# Patient Record
Sex: Female | Born: 1965 | Race: White | Hispanic: No
Health system: Midwestern US, Academic
[De-identification: ages and names within clinical notes are randomized; demographics above are authoritative.]

---

## 2019-10-30 ENCOUNTER — Encounter: Admit: 2019-10-30 | Discharge: 2019-10-30 | Payer: BC Managed Care – PPO

## 2019-10-30 DIAGNOSIS — B999 Unspecified infectious disease: Secondary | ICD-10-CM

## 2019-10-30 DIAGNOSIS — M069 Rheumatoid arthritis, unspecified: Secondary | ICD-10-CM

## 2019-10-30 DIAGNOSIS — L719 Rosacea, unspecified: Secondary | ICD-10-CM

## 2019-10-30 DIAGNOSIS — H18509 Corneal dystrophy: Secondary | ICD-10-CM

## 2019-10-30 DIAGNOSIS — M1990 Unspecified osteoarthritis, unspecified site: Secondary | ICD-10-CM

## 2019-10-31 ENCOUNTER — Encounter: Admit: 2019-10-31 | Discharge: 2019-10-31 | Payer: BC Managed Care – PPO

## 2019-11-01 ENCOUNTER — Encounter: Admit: 2019-11-01 | Discharge: 2019-11-01 | Payer: BC Managed Care – PPO

## 2019-11-02 ENCOUNTER — Encounter: Admit: 2019-11-02 | Discharge: 2019-11-02 | Payer: BC Managed Care – PPO

## 2019-11-02 DIAGNOSIS — M542 Cervicalgia: Secondary | ICD-10-CM

## 2019-11-02 NOTE — Patient Instructions
It was nice to see you today. Thank you for choosing to visit our clinic.        Your time is important and if you had to wait today, we do apologize. Our goal is to run exactly on time; however, on occasion, we get behind in clinic due to unexpected patient issues. Thank you for your patience.     General Instructions:   How to reach me: Please send a MyChart message to the Spine Center or leave a voicemail for Adam, RN at [phone].   How to get a medication refill: Please use the MyChart Refill request or contact your pharmacy directly to request medication refills. We do not do same day refills on controlled substances.   How to receive your test results: If you have signed up for MyChart, you will receive your test results and messages from me this way. Otherwise, you will get a phone call or letter. If you are expecting results and have not heard from my office within 2 weeks of your testing, please send a MyChart message or call my office.   Scheduling: Our scheduling phone number is [phone].   Appointment Reminders on your cell phone: Make sure we have your cell phone number, and Text [place] to [phone].   Support for many chronic illnesses is available through [facility]: [facility] or 913-574-0900.   For questions on nights, weekends or holidays, call the Operator at [phone], and ask for the doctor on call for Orthopedics        Again, thank you for coming in today.

## 2019-11-06 ENCOUNTER — Encounter: Admit: 2019-11-06 | Discharge: 2019-11-06 | Payer: BC Managed Care – PPO

## 2019-11-06 ENCOUNTER — Ambulatory Visit: Admit: 2019-11-06 | Discharge: 2019-11-06 | Payer: BC Managed Care – PPO

## 2019-11-06 DIAGNOSIS — L719 Rosacea, unspecified: Secondary | ICD-10-CM

## 2019-11-06 DIAGNOSIS — R519 Generalized headaches: Secondary | ICD-10-CM

## 2019-11-06 DIAGNOSIS — B999 Unspecified infectious disease: Secondary | ICD-10-CM

## 2019-11-06 DIAGNOSIS — M1990 Unspecified osteoarthritis, unspecified site: Secondary | ICD-10-CM

## 2019-11-06 DIAGNOSIS — M542 Cervicalgia: Secondary | ICD-10-CM

## 2019-11-06 DIAGNOSIS — S129XXD Fracture of neck, unspecified, subsequent encounter: Secondary | ICD-10-CM

## 2019-11-06 DIAGNOSIS — H18509 Corneal dystrophy: Secondary | ICD-10-CM

## 2019-11-06 DIAGNOSIS — M069 Rheumatoid arthritis, unspecified: Secondary | ICD-10-CM

## 2019-11-06 DIAGNOSIS — K259 Gastric ulcer, unspecified as acute or chronic, without hemorrhage or perforation: Secondary | ICD-10-CM

## 2019-11-06 DIAGNOSIS — D649 Anemia, unspecified: Secondary | ICD-10-CM

## 2019-11-09 ENCOUNTER — Encounter: Admit: 2019-11-09 | Discharge: 2019-11-09 | Payer: BC Managed Care – PPO

## 2019-11-09 NOTE — Telephone Encounter
Received VM from [facility] radiology stating that order for CT cervical spine needs physical signature. Fax [phone]    Ramonita Lab, RN

## 2019-11-13 ENCOUNTER — Encounter: Admit: 2019-11-13 | Discharge: 2019-11-13 | Payer: BC Managed Care – PPO

## 2019-11-13 NOTE — Telephone Encounter
Placed call to [facility] to request imaging to be clouded.    Ramonita Lab, RN

## 2019-11-14 ENCOUNTER — Encounter: Admit: 2019-11-14 | Discharge: 2019-11-14 | Payer: BC Managed Care – PPO

## 2019-11-21 ENCOUNTER — Encounter: Admit: 2019-11-21 | Discharge: 2019-11-21 | Payer: BC Managed Care – PPO

## 2019-11-21 DIAGNOSIS — M542 Cervicalgia: Secondary | ICD-10-CM

## 2019-11-22 ENCOUNTER — Encounter: Admit: 2019-11-22 | Discharge: 2019-11-22 | Payer: BC Managed Care – PPO

## 2019-11-22 ENCOUNTER — Ambulatory Visit: Admit: 2019-11-22 | Discharge: 2019-11-22 | Payer: BC Managed Care – PPO

## 2019-11-22 MED ORDER — BUPIVACAINE (PF) 0.25 % (2.5 MG/ML) IJ SOLN
3 mL | Freq: Once | INTRAMUSCULAR | 0 refills | Status: CP
Start: 2019-11-22 — End: ?
  Administered 2019-11-22: 16:00:00 3 mL via INTRAMUSCULAR

## 2019-11-22 MED ORDER — IOPAMIDOL 41 % IT SOLN
2.5 mL | Freq: Once | EPIDURAL | 0 refills | Status: CP
Start: 2019-11-22 — End: ?
  Administered 2019-11-22: 16:00:00 2.5 mL via EPIDURAL

## 2019-11-22 MED ORDER — IOHEXOL 240 MG IODINE/ML IV SOLN
2.5 mL | Freq: Once | EPIDURAL | 0 refills | Status: DC
Start: 2019-11-22 — End: 2019-11-22

## 2019-11-23 ENCOUNTER — Encounter: Admit: 2019-11-23 | Discharge: 2019-11-23 | Payer: BC Managed Care – PPO

## 2019-11-23 NOTE — Telephone Encounter
Procedure: SNR with Bupivicaine  Laterality: Right  Location: C6-7     Relief percentage 50% lasting 2 hours      Ramonita Lab, RN

## 2019-11-30 ENCOUNTER — Encounter: Admit: 2019-11-30 | Discharge: 2019-11-30 | Payer: BC Managed Care – PPO

## 2019-11-30 NOTE — Telephone Encounter
Patient's husband LVM re: results. Returned call, previous note states Dr. Kalman Shan would like to see patient in clinic for follow up related to [facility] and CT Scan, apt scheduled.

## 2019-11-30 NOTE — Telephone Encounter
Pain diary from [facility]

## 2019-12-25 ENCOUNTER — Encounter: Admit: 2019-12-25 | Discharge: 2019-12-25 | Payer: BC Managed Care – PPO

## 2019-12-25 ENCOUNTER — Ambulatory Visit: Admit: 2019-12-25 | Discharge: 2019-12-25 | Payer: BC Managed Care – PPO

## 2019-12-25 NOTE — Patient Instructions
It was nice to see you today.  Thank you for choosing to visit our clinic.    Your time is important and if you had to wait today, we do apologize.  Our goal is to run exactly on time.  However, on occasion, we get behind in clinic due to unexpected patient issues.  Thank you for your patience.    General Instructions:   How to reach me:  Please send a MyChart message to the Spine Center or leave a voicemail for the nurse at [phone].   How to get a medication refill:  Please use the MyChart Refill request or contact your pharmacy directly to request medication refills.  We do not do same day refills on controlled substances.   How to receive your test results:  If you have signed up for MyChart, you will receive your test results and messages from me this way.  Otherwise, you will get a phone call or letter.  If you are expecting results and have not heard from my office within 2 weeks of your testing, please send a MyChart message or call my office.   Scheduling:  Our scheduling phone number is [phone].   Appointment Reminders on your cell phone:  Make sure we have your cell phone number, and Text [place] to [phone].   Support for many chronic illnesses is available through [facility]: [facility] or 913-574-0900.   For questions on nights, weekends or holidays, call the Operator at [phone], and ask for the doctor on call for Orthopedics.      Again, thank you for coming in today.

## 2020-02-01 ENCOUNTER — Encounter: Admit: 2020-02-01 | Discharge: 2020-02-01 | Payer: BC Managed Care – PPO

## 2020-02-01 DIAGNOSIS — S129XXD Fracture of neck, unspecified, subsequent encounter: Secondary | ICD-10-CM

## 2020-02-01 DIAGNOSIS — Z01812 Encounter for preprocedural laboratory examination: Secondary | ICD-10-CM

## 2020-02-01 MED ORDER — CEFAZOLIN INJ 1GM IVP
2 g | Freq: Once | INTRAVENOUS | 0 refills | Status: CN
Start: 2020-02-01 — End: ?

## 2020-02-01 NOTE — Progress Notes
Surgery Scheduling Form    Name: BRAIDYN PEACE MRN: [id number] DOB: Feb 07, 1966 Sex: female     Patient Type: EXT REC  Procedure: c6-7 posterior cervical wiring and foraminotomy   Length: 120 minutes    CPT Codes: 45409, 22600, 22660, 63040    Dx:   pseutoarthrosis of cervical spine    Workup Needed: standard    Work Comp?: No      Positioning: prone    Equipment: neuromonitoring, mayfield tongs    Implants?: Yes . Co Chrome wires      Procedure, codes, and equipment verified: Yes

## 2020-02-01 NOTE — Telephone Encounter
LM to schedule pre-op and surgery dates

## 2020-02-01 NOTE — Telephone Encounter
June 23rd for surgery date, pre-op scheduled with patient.  Info sent to [facility] for prior auth.

## 2020-02-19 ENCOUNTER — Encounter: Admit: 2020-02-19 | Discharge: 2020-02-19 | Payer: BC Managed Care – PPO

## 2020-02-19 NOTE — Telephone Encounter
Spoke with patient. Yes Madeline Barry has been approved through [employer] per [facility]. She has no other questions or concerns at this time.

## 2020-02-19 NOTE — Telephone Encounter
Email sent to [facility] to follow up on surgery auth status

## 2020-02-19 NOTE — Telephone Encounter
Per [facility] - Yes, the prior Berkley Harvey has been approved

## 2020-02-19 NOTE — Telephone Encounter
Incoming VM - surgery on 6.23 and wanted to know if [employer] has approved prior auth yet

## 2020-02-28 NOTE — Patient Instructions
Thank you for visiting [place] at the [facility] at [facility].    Today you saw Dr. Mare Ferrari for your Pre-Op Visit.  ? You will receive a call the day prior to your surgery with your official ARRIVAL TIME  ? This call will be in the evening prior to surgery between 2pm and 6pm  ? For questions regarding our visitor policy please call [phone] or visit: [email or web address].  ? Visitor Policy    Below are your instructions for your upcoming surgery. We also encourage you to review the following to learn more about your health:  ? Education attached to this visit summary  ? [facility] located in our lobby  ? [email or web address]    We also highly recommend you sign-up for MyChart, our patient portal. With MyChart you can:  ? Communicate with your care team   ? Access your lab and test results  ? View upcoming appointment information    If you need any assistance accessing MyChart, please call [phone].    Your care team is here to help you, please do not hesitate to contact us with any questions or concerns.      Clinical Nurse Coordinator - Dr. Mare Ferrari, MD  The [facility] [place] [facility]   [facility]  Phone: [phone]        Instructions    Getting Ready for Surgery    FMLA/ Short Term Disability Paperwork    ? Please fax any FMLA/ Short Term Disability paperwork to [phone]. Please note that we require 72 business hours to complete these forms and have them signed by Dr. Azucena Cecil.  ? Your paperwork should clearly state to whom and where forms are to be sent upon completion.  ? For any questions regarding your paperwork, please call [phone] Aurther Loft Orrick: Environmental health practitioner).  ? Please do not mail or drop off paperwork to the Spine Center, this can delay completion of your paperwork.    COVID-19    ? You must be tested for COVID-19 exactly 2 days prior to surgery.  ? This test must be performed at one of our Testing Clinics, this is to ensure we have the most up-to-date result possible to protect your safety.  ? Directions and instructions for this test will be under the appointment section of your After Visit Summary.    Night Before Surgery    ? You will not be able to eat or drink after midnight on the morning of your surgery.  ? You should prepare loose, comfortable clothing for your hospital stay.  ? During your pre-operative visit, your Anesthesia provider will tell you which medicines you may take the morning of surgery.    Surgical Prep/ Shower:    ? You will need to take a special shower the night before AND the morning of your procedure.  ? Our office will provide you with an anti-bacterial soap (CHG Scrub) during your pre-operative visit.     1. The night before surgery you will need to shower  2. Use your typical bath products and rinse  3. After rinsing, complete the CHG scrub  4. Lather CHG well for a full 5 minutes  5. Rinse completely  6. Dry with a clean towel  7. Do not apply any lotion, cream, powder, or deodorant as this can deactivate the CHG  8. Repeat the CHG scrub in the morning at home before admission  Your Hospital Stay  Orthopedic Unit    ? If your surgery requires admission to the hospital, you will most likely be staying in our specialized orthopedic unit in [facility] [address].  ? We request private rooms for all of our patients, but please be aware that you may be admitted to a semi-private (shared) room.    Preventing Falls    ? After surgery do not get out of bed alone, you will need a nurse or patient care assistant to help you walk and to go to the bathroom.  ? You will be working with physical therapy to make a therapy plan and recovery goals.  ? Physical  therapy will help your team decide how to get you home safely, or if you may need skilled care after your hospital stay.    Pain    ? Spinal surgery is not a pain free process, but we will do everything we can to manage your pain.  ? You should not expect to have immediate relief of your symptoms, healing and recovery will take time depending on your procedure.  ? Please let your nurse know if your pain is not managed so they can work to keep you as comfortable as possible and to help you recover.  ? Bedrest will not help you recover from surgery, the more you walk, the faster you will recover from surgery.        Recovering at [facility] of your incision    ? Your incision must stay DRY after surgery.  ? A detachable shower head is recommended for bathing after surgery.  ? Change the bandage on the incision EVERY OTHER DAY.   ? Dress incision with clean gauze and tape.  ? Report ANY change in drainage.  ? Report ANY increasing redness.  ? Please do not wait to notify Dr. Louie Boston office of any signs of wound infection, this could allow any potential infection to worsen and delay your recovery.  ? Failure to report signs of infection could make you very sick and could potentially require repeat surgery.  ? If you have ANY concerning symptoms, it is always better to ask.    Signs of Infection    ? Temperature of 100 degrees Farenheit or higher  ? Fever  ? Chills  ? Redness of incision  ? Drainage from incision     Pain Medication    ? Spine surgery can be painful and it may not be possible to relieve 100% of your post-operative pain.  ? We want your pain to be as controlled as possible so that you are able to be active after surgery (walking a total of 30 minutes per day).  ? Only take medication if you need it. Decrease your dosage as you are able.  ? Do not obtain any prescriptions for pain from other providers until you have been released from Dr. Louie Boston care.  ? We cannot refill medications same day: please call AT LEAST 2 business days in advance so that you do not run out.  ? If your pain is uncontrolled, please call Dr. Louie Boston office.    Constipation    ? Pain medication is often constipating.  ? Before surgery, purchase an over-the counter stool softener (colace, docusate, or miralax). You may take these medications 1 to 2 times per day depending on your needs.  ? Drink 6-8 glasses of water everyday.  ? Contact our office if you have gone more  than 3 days with no bowel movement.    Activity    ? NO bending or twisting of the spine.  ? NO lifting greater than 5lbs (similar to a gallon of milk).  ? NO activity that requires gym equipment.  ? NO driving until you are off mind-altering medications and you are cleared by Dr. Azucena Cecil.  ? NO physical therapy or occupational therapy unless cleared by Dr.Burton. If physical therapy is needed, this will be ordered approximately 8 weeks after surgery.  ? Walking is your recommended therapy/exercise after surgery, we encourage you to be walking at a natural, gentle pace for at least 30 minutes total per day.        Cervical (Neck) Collar Care    For patients undergoing spinal fusion of the neck: You will be discharged from the hospital wearing a neck collar for 3-6 weeks post-op    Motion Restriction    ? Keeping your head and neck as still as possible is an important part of the healing process  ? Keep your collar on and properly tightened at all times. Remove it only to wash your face and neck  ? Remove your collar only with the help of another person    Proper Skin Care    ? Pressure, moisture, heat, and dirt can all lead to skin redness and sores  ? To avoid this, keep your skin clean, dry, and cool  ? At least once every other day, remove the collar and wash your neck and face  ? At this time, moist or dirty pads should be changed  ? Keep your head and neck still while the collar is off  ? If you notice any skin redness or sores, call our office    Instructions for Removal, Skin Care, and Re-Application    ? Before taking off your collar, gather the supplies you will need: soap, wash cloth, towel, and pads.  ? Stand or sit in front of a sink with a mirror. Release the strap on one side. Remove the collar and set it aside.  ? Keep your head and neck straight and still. Use a wash cloth to clean your face and neck.  ? Rinse away soap and gently dry your skin.  ? Remove moist and/or dirty pads.  ? If needed, clean and towel dry the plastic and straps. Attach the clean pads.  ? Place the front of the collar so your chin comes to the front edge of the chin piece.  ? Place the back panel behind your neck.  ? Connect the straps on both sides and tighten.  ? Tighten the Support Strap until secure and comfortable         Clinical Nurse Coordinator ? Dr. Mare Ferrari, M.D.  The [facility]  [place], [place] [address]  Phone: [phone]

## 2020-02-29 ENCOUNTER — Encounter: Admit: 2020-02-29 | Discharge: 2020-02-29 | Payer: BC Managed Care – PPO

## 2020-02-29 ENCOUNTER — Ambulatory Visit: Admit: 2020-02-29 | Discharge: 2020-02-29 | Payer: BC Managed Care – PPO

## 2020-02-29 DIAGNOSIS — L719 Rosacea, unspecified: Secondary | ICD-10-CM

## 2020-02-29 DIAGNOSIS — M797 Fibromyalgia: Secondary | ICD-10-CM

## 2020-02-29 DIAGNOSIS — K219 Gastro-esophageal reflux disease without esophagitis: Secondary | ICD-10-CM

## 2020-02-29 DIAGNOSIS — M1990 Unspecified osteoarthritis, unspecified site: Secondary | ICD-10-CM

## 2020-02-29 DIAGNOSIS — S129XXD Fracture of neck, unspecified, subsequent encounter: Secondary | ICD-10-CM

## 2020-02-29 DIAGNOSIS — H18509 Corneal dystrophy: Secondary | ICD-10-CM

## 2020-02-29 DIAGNOSIS — M2550 Pain in unspecified joint: Secondary | ICD-10-CM

## 2020-02-29 DIAGNOSIS — Z01818 Encounter for other preprocedural examination: Secondary | ICD-10-CM

## 2020-02-29 DIAGNOSIS — R519 Generalized headaches: Secondary | ICD-10-CM

## 2020-02-29 DIAGNOSIS — G629 Polyneuropathy, unspecified: Secondary | ICD-10-CM

## 2020-02-29 DIAGNOSIS — F329 Major depressive disorder, single episode, unspecified: Secondary | ICD-10-CM

## 2020-02-29 DIAGNOSIS — T148XXA Other injury of unspecified body region, initial encounter: Secondary | ICD-10-CM

## 2020-02-29 DIAGNOSIS — K259 Gastric ulcer, unspecified as acute or chronic, without hemorrhage or perforation: Secondary | ICD-10-CM

## 2020-02-29 DIAGNOSIS — B999 Unspecified infectious disease: Secondary | ICD-10-CM

## 2020-02-29 DIAGNOSIS — Z01812 Encounter for preprocedural laboratory examination: Secondary | ICD-10-CM

## 2020-02-29 DIAGNOSIS — R112 Nausea with vomiting, unspecified: Secondary | ICD-10-CM

## 2020-02-29 DIAGNOSIS — D649 Anemia, unspecified: Secondary | ICD-10-CM

## 2020-02-29 DIAGNOSIS — M069 Rheumatoid arthritis, unspecified: Secondary | ICD-10-CM

## 2020-02-29 LAB — COMPREHENSIVE METABOLIC PANEL
Lab: 0.4 mg/dL (ref 0.3–1.2)
Lab: 0.7 mg/dL (ref <20.7)
Lab: 107 MMOL/L (ref 98–110)
Lab: 14 mg/dL (ref 7–25)
Lab: 141 MMOL/L (ref 137–147)
Lab: 21 U/L (ref 7–40)
Lab: 25 U/L (ref 7–56)
Lab: 26 MMOL/L (ref 21–30)
Lab: 4.2 g/dL (ref 3.5–5.0)
Lab: 6.5 g/dL (ref 6.0–8.0)
Lab: 82 U/L (ref 25–110)
Lab: 9.2 mg/dL (ref 8.5–10.6)
Lab: 93 mg/dL (ref 70–100)
Lab: >60 mL/min (ref >60)
Lab: >60 mL/min (ref >60)
Lab: 8 (ref 3–12)

## 2020-02-29 LAB — PROTIME INR (PT): Lab: 1.1 MMOL/L (ref 0.8–1.2)

## 2020-02-29 LAB — CBC: Lab: 4.8 10*3/uL (ref >60)

## 2020-02-29 NOTE — Pre-Anesthesia Patient Instructions
GENERAL INFORMATION    Before you come to the hospital  ? Make arrangements for a responsible adult to drive you home and stay with you for 24 hours following surgery.  ? Bath/Shower Instructions  ? Take a bath or shower with antibacterial soap the night before or the morning of your procedure. Use clean towels.  ? Put on clean clothes after bath or shower.  Avoid using lotion and oils.  ? If you are having surgery above the waist, wear a shirt that fastens up the front.  ? Sleep on clean sheets if bath or shower is done the night before procedure.  ? Leave money, credit cards, jewelry, and any other valuables at home. [facility] is not responsible for the loss or breakage of personal items.  ? Remove nail polish, makeup and all jewelry (including piercings) before coming to the hospital.  ? The morning of your procedure:  ? brush your teeth and tongue  ? do not smoke  ? do not shave the area where you will have surgery    What to bring to the hospital  ? ID/ Insurance Card  ? Medical Device card  ? Official documents for legal guardianship   ? Copy of your Living Will, Advanced Directives, and/or Durable Power of Attorney   ? Small bag with a few personal belongings  ? CPAP/BiPAP machine (including all supplies)  ? Walker,cane, or motorized scooter  ? Cases for glasses/hearing aids/contact lens (bring solutions for contacts)  ? Dress in clean, loose, comfortable clothing     Eating or drinking before surgery  ? Do not eat or drink anything after 11:00 p.m. the day before your procedure (including gum, mints, candy, or chewing tobacco) OR follow the specific instructions you were given by your Surgeon.  ? You may have WATER ONLY up to 2 hours before arriving at the hospital.     Other instructions  Notify your surgeon if:  ? there is a possibility that you are pregnant  ? you become ill with a cough, fever, sore throat, nausea, vomiting or flu-like symptoms  ? you develop a rash or have any open wounds/sores that are red, painful, draining, or are new since you last saw  the doctor  ? you need to cancel your procedure  ? You will receive a call with your surgery arrival time from between 2:30pm and 4:30pm the last business day before your procedure.  If you do not receive a call, please call [phone] before 4:30pm or [phone] after 4:30pm.    Notify us at [facility]: [phone]  ? if you need to cancel your procedure  ? if you are going to be late    Arrival at the hospital  [facility]  [address]  [place], [place] [address]    ? Park in the [employer], located directly across from the main entrance to the hospital.  ? Judee Clara parking is available  from 7 AM to 4 PM Monday through Friday.  ? Enter through the ground floor main hospital entrance and check in at the Information Desk in the lobby.  ? They will validate your parking ticket and direct you to the next location.  ? If you are a woman between the ages of 35 and 12, and have not had a hysterectomy, you will be asked for a urine sample prior to surgery.  Please do not urinate before arriving in the Surgery Waiting  Room.  Once there, check in and let the attendant know if you need to provide a sample.      Coronavirus (COVID19) Information  If you get sick with fever (100.44F/38C or higher), cough, or have trouble breathing:  ? Call your primary care physician for questions or health needs.  ? Tell your doctor about any recent travel and your symptoms.  ? Check your MyChart for your COVID swab results. (MyChart notifications are immediate and patients are often know their test result before their surgeon is notified).  ? Notify your surgeon if you are COVID+ positive.  ? If you are COVID+ positive DO NOT come to the hospital for your surgery until your surgeon has instructed you on what to do. Wait for instructions to find out if you should stay home or if you should still have surgery.  ? Avoid contact with others.    For up to date information on the Coronavirus, visit the CDC website at DiningCalendar.de.      For the safety of all patients, visitors and staff as we work to contain COVID-19, we must restrict patient visitors.    Current Visitor Policy (02/04/20):    Our current, and ongoing, visitor rules in surgery and procedural areas are:    1 visitor per patient will be allowed to accompany the patient and wait in the Waiting Room  No visitors will be allowed into the  pre/post areas       Patients in inpatient and pediatric units, Emergency Department, ambulatory clinics and lab appointments may now have two visitors at the same time.  For inpatient stays, there is no limit on the amount of visitors per day but only two visitors may visit at a time.  The policy applies to [facility] [place], [address], [employer] [place] and [place] campuses and clinics.      Exceptions include:  No visitors allowed for patients with active COVID-19 infections.  One visitor allowed during a patient?s cancer exam appointment; however, no visitors allowed during their cancer treatment/infusion appointment; check with staff for exceptions.  One visitor allowed in perioperative and procedural waiting rooms; no visitors allowed in pre/post areas unless a patient becomes an overnight boarder.  Two parents/guardians are allowed for surgical or procedural patients younger than 54 years old.  Adult inpatients in semiprivate rooms may have visitors, but visits should be coordinated so only two total visitors are in a room at a time due to space limitations.    Visitors will continue to be screened at all entrances. They must be free of fever and symptoms to be in our facilities. We ask visitors to follow these guidelines:  Wear a mask at all times.  Go directly to the nursing station in the unit you are visiting and do not linger in public areas.  Check in at the nursing station before going to the patient's room. Maintain a physical distance of six feet from all others.  Follow elevator restrictions to four riding at a time - peak times are 6:30-7:30 a.m., noon and 6:30-7:30 p.m.  Be aware cafeteria peak times are 11 a.m. - 1 p.m.  Wash your hands frequently and cover your coughs and sneezes.    If you are having an outpatient procedure, you will need to arrange for a responsible ride/person to accompany you home due to sedation or anesthesia with your procedure. A responsible person is a person who has the ability to identify a change  in the patient's status and notify medical personnel.  This is typically a family member or friend.  Public transportation is permitted if you have a responsible person to accompany you.  An Benedetto Goad, taxi or other public transportation driver is not considered a responsible person to accompany you home.

## 2020-03-05 ENCOUNTER — Encounter: Admit: 2020-03-05 | Discharge: 2020-03-05 | Payer: BC Managed Care – PPO

## 2020-03-05 DIAGNOSIS — R519 Generalized headaches: Secondary | ICD-10-CM

## 2020-03-05 DIAGNOSIS — K259 Gastric ulcer, unspecified as acute or chronic, without hemorrhage or perforation: Secondary | ICD-10-CM

## 2020-03-05 DIAGNOSIS — R112 Nausea with vomiting, unspecified: Secondary | ICD-10-CM

## 2020-03-05 DIAGNOSIS — G629 Polyneuropathy, unspecified: Secondary | ICD-10-CM

## 2020-03-05 DIAGNOSIS — K219 Gastro-esophageal reflux disease without esophagitis: Secondary | ICD-10-CM

## 2020-03-05 DIAGNOSIS — L719 Rosacea, unspecified: Secondary | ICD-10-CM

## 2020-03-05 DIAGNOSIS — M797 Fibromyalgia: Secondary | ICD-10-CM

## 2020-03-05 DIAGNOSIS — B999 Unspecified infectious disease: Secondary | ICD-10-CM

## 2020-03-05 DIAGNOSIS — T148XXA Other injury of unspecified body region, initial encounter: Secondary | ICD-10-CM

## 2020-03-05 DIAGNOSIS — H18509 Corneal dystrophy: Secondary | ICD-10-CM

## 2020-03-05 DIAGNOSIS — M2550 Pain in unspecified joint: Secondary | ICD-10-CM

## 2020-03-05 DIAGNOSIS — M1990 Unspecified osteoarthritis, unspecified site: Secondary | ICD-10-CM

## 2020-03-05 DIAGNOSIS — D649 Anemia, unspecified: Secondary | ICD-10-CM

## 2020-03-05 DIAGNOSIS — M069 Rheumatoid arthritis, unspecified: Secondary | ICD-10-CM

## 2020-03-05 DIAGNOSIS — F329 Major depressive disorder, single episode, unspecified: Secondary | ICD-10-CM

## 2020-03-05 MED ORDER — OXYCODONE 5 MG PO TAB
5-10 mg | Freq: Once | ORAL | 0 refills | Status: CP | PRN
Start: 2020-03-05 — End: ?
  Administered 2020-03-05: 16:00:00 10 mg via ORAL

## 2020-03-05 MED ORDER — HYDROMORPHONE (PF) 2 MG/ML IJ SYRG
1 mg | INTRAVENOUS | 0 refills | Status: DC | PRN
Start: 2020-03-05 — End: 2020-03-05

## 2020-03-05 MED ORDER — DEXAMETHASONE SODIUM PHOSPHATE 4 MG/ML IJ SOLN
INTRAVENOUS | 0 refills | Status: DC
Start: 2020-03-05 — End: 2020-03-05
  Administered 2020-03-05: 13:00:00 4 mg via INTRAVENOUS

## 2020-03-05 MED ORDER — CEFAZOLIN INJ 1GM IVP
2 g | Freq: Once | INTRAVENOUS | 0 refills | Status: CP
Start: 2020-03-05 — End: ?
  Administered 2020-03-05: 13:00:00 2 g via INTRAVENOUS

## 2020-03-05 MED ORDER — MIDAZOLAM 1 MG/ML IJ SOLN
INTRAVENOUS | 0 refills | Status: DC
Start: 2020-03-05 — End: 2020-03-05
  Administered 2020-03-05: 12:00:00 2 mg via INTRAVENOUS

## 2020-03-05 MED ORDER — DIPHENHYDRAMINE HCL 25 MG PO CAP
25 mg | ORAL | 0 refills | Status: DC | PRN
Start: 2020-03-05 — End: 2020-03-07
  Administered 2020-03-07 (×2): 25 mg via ORAL

## 2020-03-05 MED ORDER — ELECTROLYTE-A IV SOLP
INTRAVENOUS | 0 refills | Status: DC
Start: 2020-03-05 — End: 2020-03-05
  Administered 2020-03-05: 13:00:00 via INTRAVENOUS

## 2020-03-05 MED ORDER — IMS MIXTURE TEMPLATE
75 mg | Freq: Every evening | ORAL | 0 refills | Status: DC
Start: 2020-03-05 — End: 2020-03-07
  Administered 2020-03-06 – 2020-03-07 (×4): 75 mg via ORAL

## 2020-03-05 MED ORDER — PHENYLEPHRINE HCL IN 0.9% NACL 1 MG/10 ML (100 MCG/ML) IV SYRG
INTRAVENOUS | 0 refills | Status: DC
Start: 2020-03-05 — End: 2020-03-05
  Administered 2020-03-05 (×7): 100 ug via INTRAVENOUS

## 2020-03-05 MED ORDER — LACTATED RINGERS IV SOLP
1000 mL | INTRAVENOUS | 0 refills | Status: AC
Start: 2020-03-05 — End: ?
  Administered 2020-03-05 (×2): 1000 mL via INTRAVENOUS

## 2020-03-05 MED ORDER — SUCCINYLCHOLINE CHLORIDE 20 MG/ML IJ SOLN
INTRAVENOUS | 0 refills | Status: DC
Start: 2020-03-05 — End: 2020-03-05
  Administered 2020-03-05: 12:00:00 100 mg via INTRAVENOUS

## 2020-03-05 MED ORDER — MORPHINE 2 MG/ML IV SYRG
1-2 mg | INTRAVENOUS | 0 refills | Status: DC | PRN
Start: 2020-03-05 — End: 2020-03-07
  Administered 2020-03-05: 1 mg via INTRAVENOUS
  Administered 2020-03-06 (×2): 2 mg via INTRAVENOUS

## 2020-03-05 MED ORDER — LIDOCAINE (PF) 20 MG/ML (2 %) IJ SOLN
INTRAVENOUS | 0 refills | Status: DC
Start: 2020-03-05 — End: 2020-03-05
  Administered 2020-03-05: 12:00:00 60 mg via INTRAVENOUS

## 2020-03-05 MED ORDER — DOXYCYCLINE HYCLATE 100 MG PO TAB
100 mg | Freq: Two times a day (BID) | ORAL | 0 refills | Status: DC
Start: 2020-03-05 — End: 2020-03-07
  Administered 2020-03-06 – 2020-03-07 (×2): 100 mg via ORAL

## 2020-03-05 MED ORDER — DOCUSATE SODIUM 100 MG PO CAP
100 mg | Freq: Two times a day (BID) | ORAL | 0 refills | Status: DC
Start: 2020-03-05 — End: 2020-03-07
  Administered 2020-03-05 – 2020-03-07 (×5): 100 mg via ORAL

## 2020-03-05 MED ORDER — POLYETHYLENE GLYCOL 3350 17 GRAM PO PWPK
1 | Freq: Two times a day (BID) | ORAL | 0 refills | Status: DC
Start: 2020-03-05 — End: 2020-03-07
  Administered 2020-03-05 – 2020-03-07 (×4): 17 g via ORAL

## 2020-03-05 MED ORDER — HYDROMORPHONE (PF) 2 MG/ML IJ SYRG
.5 mg | INTRAVENOUS | 0 refills | Status: DC | PRN
Start: 2020-03-05 — End: 2020-03-05
  Administered 2020-03-05 (×2): 0.5 mg via INTRAVENOUS

## 2020-03-05 MED ORDER — ONDANSETRON HCL (PF) 4 MG/2 ML IJ SOLN
4 mg | Freq: Once | INTRAVENOUS | 0 refills | Status: DC | PRN
Start: 2020-03-05 — End: 2020-03-05

## 2020-03-05 MED ORDER — THROMBIN (BOVINE) 5,000 UNIT TP SOLR
0 refills | Status: DC
Start: 2020-03-05 — End: 2020-03-05
  Administered 2020-03-05: 14:00:00 5000 [IU] via TOPICAL

## 2020-03-05 MED ORDER — PROPOFOL 10 MG/ML IV EMUL 100 ML (INFUSION)(AM)(OR)
INTRAVENOUS | 0 refills | Status: DC
Start: 2020-03-05 — End: 2020-03-05
  Administered 2020-03-05: 12:00:00 120 ug/kg/min via INTRAVENOUS

## 2020-03-05 MED ORDER — ARTIFICIAL TEARS SINGLE DOSE DROPS GROUP
OPHTHALMIC | 0 refills | Status: DC
Start: 2020-03-05 — End: 2020-03-05
  Administered 2020-03-05: 12:00:00 2 [drp] via OPHTHALMIC

## 2020-03-05 MED ORDER — PHENYLEPHRINE 40 MCG/ML IN NS IV DRIP (STD CONC)
INTRAVENOUS | 0 refills | Status: DC
Start: 2020-03-05 — End: 2020-03-05
  Administered 2020-03-05 (×2): .3 ug/kg/min via INTRAVENOUS

## 2020-03-05 MED ORDER — MEPERIDINE (PF) 25 MG/ML IJ SYRG
12.5 mg | INTRAVENOUS | 0 refills | Status: DC | PRN
Start: 2020-03-05 — End: 2020-03-05

## 2020-03-05 MED ORDER — ACETAMINOPHEN 500 MG PO TAB
1000 mg | Freq: Once | ORAL | 0 refills | Status: CP
Start: 2020-03-05 — End: ?
  Administered 2020-03-05: 12:00:00 1000 mg via ORAL

## 2020-03-05 MED ORDER — HYDROMORPHONE (PF) 2 MG/ML IJ SYRG
INTRAVENOUS | 0 refills | Status: DC
Start: 2020-03-05 — End: 2020-03-05
  Administered 2020-03-05: 14:00:00 .5 mg via INTRAVENOUS
  Administered 2020-03-05: 15:00:00 .25 mg via INTRAVENOUS

## 2020-03-05 MED ORDER — SODIUM CHLORIDE 0.9 % TKO FLUID
INTRAVENOUS | 0 refills | Status: DC
Start: 2020-03-05 — End: 2020-03-07
  Administered 2020-03-05: 21:00:00 via INTRAVENOUS

## 2020-03-05 MED ORDER — DIPHENHYDRAMINE HCL 50 MG/ML IJ SOLN
25 mg | INTRAVENOUS | 0 refills | Status: DC | PRN
Start: 2020-03-05 — End: 2020-03-07

## 2020-03-05 MED ORDER — DOCUSATE SODIUM 100 MG PO CAP
100 mg | Freq: Two times a day (BID) | ORAL | 0 refills | Status: DC
Start: 2020-03-05 — End: 2020-03-05

## 2020-03-05 MED ORDER — ROCURONIUM 10 MG/ML IV SOLN GROUP
INTRAVENOUS | 0 refills | Status: DC
Start: 2020-03-05 — End: 2020-03-05
  Administered 2020-03-05: 14:00:00 5 mg via INTRAVENOUS

## 2020-03-05 MED ORDER — FENTANYL CITRATE (PF) 50 MCG/ML IJ SOLN
INTRAVENOUS | 0 refills | Status: DC
Start: 2020-03-05 — End: 2020-03-05

## 2020-03-05 MED ORDER — METHOCARBAMOL IVPB
1000 mg | Freq: Once | INTRAVENOUS | 0 refills | Status: CP
Start: 2020-03-05 — End: ?
  Administered 2020-03-05 (×2): 1000 mg via INTRAVENOUS

## 2020-03-05 MED ORDER — REMIFENTANYL 1000MCG IN NS 20ML (OR)
INTRAVENOUS | 0 refills | Status: DC
Start: 2020-03-05 — End: 2020-03-05
  Administered 2020-03-05 (×2): .08 ug/kg/min via INTRAVENOUS

## 2020-03-05 MED ORDER — TRAMADOL 50 MG PO TAB
50 mg | Freq: Once | ORAL | 0 refills | Status: CP
Start: 2020-03-05 — End: ?
  Administered 2020-03-05: 12:00:00 50 mg via ORAL

## 2020-03-05 MED ORDER — ONDANSETRON HCL (PF) 4 MG/2 ML IJ SOLN
INTRAVENOUS | 0 refills | Status: DC
Start: 2020-03-05 — End: 2020-03-05
  Administered 2020-03-05: 15:00:00 4 mg via INTRAVENOUS

## 2020-03-05 MED ORDER — KETAMINE 10 MG/ML IJ SOLN
INTRAVENOUS | 0 refills | Status: DC
Start: 2020-03-05 — End: 2020-03-05
  Administered 2020-03-05 (×2): 15 mg via INTRAVENOUS

## 2020-03-05 MED ORDER — ONDANSETRON HCL (PF) 4 MG/2 ML IJ SOLN
4 mg | INTRAVENOUS | 0 refills | Status: DC | PRN
Start: 2020-03-05 — End: 2020-03-07
  Administered 2020-03-05 – 2020-03-06 (×2): 4 mg via INTRAVENOUS

## 2020-03-05 MED ORDER — FENTANYL CITRATE (PF) 50 MCG/ML IJ SOLN
25 ug | INTRAVENOUS | 0 refills | Status: DC | PRN
Start: 2020-03-05 — End: 2020-03-05
  Administered 2020-03-05: 16:00:00 25 ug via INTRAVENOUS

## 2020-03-05 MED ORDER — NALOXONE 0.4 MG/ML IJ SOLN
.08 mg | INTRAVENOUS | 0 refills | Status: DC | PRN
Start: 2020-03-05 — End: 2020-03-05

## 2020-03-05 MED ORDER — VANCOMYCIN 1,000 MG IV SOLR
0 refills | Status: DC
Start: 2020-03-05 — End: 2020-03-05
  Administered 2020-03-05: 14:00:00 1 g via TOPICAL

## 2020-03-05 MED ORDER — ACETAMINOPHEN 500 MG PO TAB
1000 mg | Freq: Once | ORAL | 0 refills | Status: DC | PRN
Start: 2020-03-05 — End: 2020-03-05

## 2020-03-05 MED ORDER — PROCHLORPERAZINE EDISYLATE 5 MG/ML IJ SOLN
10 mg | INTRAVENOUS | 0 refills | Status: DC | PRN
Start: 2020-03-05 — End: 2020-03-07
  Administered 2020-03-06: 03:00:00 10 mg via INTRAVENOUS

## 2020-03-05 MED ORDER — BUPROPION XL 150 MG PO TB24
150 mg | Freq: Every day | ORAL | 0 refills | Status: DC
Start: 2020-03-05 — End: 2020-03-07
  Administered 2020-03-06 – 2020-03-07 (×2): 150 mg via ORAL

## 2020-03-05 MED ORDER — OXYCODONE 5 MG PO TAB
5-15 mg | ORAL | 0 refills | Status: DC | PRN
Start: 2020-03-05 — End: 2020-03-07
  Administered 2020-03-05 – 2020-03-06 (×2): 10 mg via ORAL
  Administered 2020-03-06 – 2020-03-07 (×5): 15 mg via ORAL

## 2020-03-05 MED ORDER — ACETAMINOPHEN 325 MG PO TAB
650 mg | ORAL | 0 refills | Status: DC
Start: 2020-03-05 — End: 2020-03-07
  Administered 2020-03-05 – 2020-03-07 (×9): 650 mg via ORAL

## 2020-03-05 MED ORDER — MORPHINE PCA 50 MG/50 ML SYR (STD CONC)(ADULT)
INTRAVENOUS | 0 refills | Status: DC
Start: 2020-03-05 — End: 2020-03-05
  Administered 2020-03-05: 16:00:00 50.000 mL via INTRAVENOUS

## 2020-03-05 MED ORDER — PROPOFOL INJ 10 MG/ML IV VIAL
INTRAVENOUS | 0 refills | Status: DC
Start: 2020-03-05 — End: 2020-03-05
  Administered 2020-03-05: 13:00:00 30 mg via INTRAVENOUS

## 2020-03-05 MED ORDER — CYCLOBENZAPRINE 10 MG PO TAB
10 mg | Freq: Once | ORAL | 0 refills | Status: CP
Start: 2020-03-05 — End: ?
  Administered 2020-03-05: 12:00:00 10 mg via ORAL

## 2020-03-05 MED ORDER — TEMAZEPAM 15 MG PO CAP
15 mg | Freq: Every evening | ORAL | 0 refills | Status: DC | PRN
Start: 2020-03-05 — End: 2020-03-07

## 2020-03-05 MED ORDER — BISACODYL 10 MG RE SUPP
10 mg | Freq: Every day | RECTAL | 0 refills | Status: DC | PRN
Start: 2020-03-05 — End: 2020-03-07

## 2020-03-05 MED ORDER — MAGNESIUM HYDROXIDE 2,400 MG/10 ML PO SUSP
10 mL | Freq: Every day | ORAL | 0 refills | Status: DC
Start: 2020-03-05 — End: 2020-03-07

## 2020-03-07 ENCOUNTER — Encounter: Admit: 2020-03-07 | Discharge: 2020-03-07 | Payer: BC Managed Care – PPO

## 2020-03-07 DIAGNOSIS — R112 Nausea with vomiting, unspecified: Secondary | ICD-10-CM

## 2020-03-07 DIAGNOSIS — M2550 Pain in unspecified joint: Secondary | ICD-10-CM

## 2020-03-07 DIAGNOSIS — M069 Rheumatoid arthritis, unspecified: Secondary | ICD-10-CM

## 2020-03-07 DIAGNOSIS — D649 Anemia, unspecified: Secondary | ICD-10-CM

## 2020-03-07 DIAGNOSIS — L719 Rosacea, unspecified: Secondary | ICD-10-CM

## 2020-03-07 DIAGNOSIS — H18509 Corneal dystrophy: Secondary | ICD-10-CM

## 2020-03-07 DIAGNOSIS — M1990 Unspecified osteoarthritis, unspecified site: Secondary | ICD-10-CM

## 2020-03-07 DIAGNOSIS — F329 Major depressive disorder, single episode, unspecified: Secondary | ICD-10-CM

## 2020-03-07 DIAGNOSIS — M797 Fibromyalgia: Secondary | ICD-10-CM

## 2020-03-07 DIAGNOSIS — R519 Generalized headaches: Secondary | ICD-10-CM

## 2020-03-07 DIAGNOSIS — G629 Polyneuropathy, unspecified: Secondary | ICD-10-CM

## 2020-03-07 DIAGNOSIS — K219 Gastro-esophageal reflux disease without esophagitis: Secondary | ICD-10-CM

## 2020-03-07 DIAGNOSIS — B999 Unspecified infectious disease: Secondary | ICD-10-CM

## 2020-03-07 DIAGNOSIS — K259 Gastric ulcer, unspecified as acute or chronic, without hemorrhage or perforation: Secondary | ICD-10-CM

## 2020-03-07 DIAGNOSIS — T148XXA Other injury of unspecified body region, initial encounter: Secondary | ICD-10-CM

## 2020-03-07 MED ORDER — OXYCODONE 5 MG PO TAB
5-15 mg | ORAL_TABLET | ORAL | 0 refills | 6.00000 days | Status: DC | PRN
Start: 2020-03-07 — End: 2020-03-12

## 2020-03-07 MED ORDER — ACETAMINOPHEN 325 MG PO TAB
650 mg | ORAL_TABLET | ORAL | 0 refills | Status: DC
Start: 2020-03-07 — End: 2020-04-15

## 2020-03-07 MED ORDER — OXYCODONE 5 MG PO TAB
5-10 mg | ORAL_TABLET | ORAL | 0 refills | 6.00000 days | Status: DC | PRN
Start: 2020-03-07 — End: 2020-03-12
  Filled 2020-03-07: qty 80, 4d supply, fill #1

## 2020-03-07 MED ORDER — DOCUSATE SODIUM 100 MG PO CAP
100 mg | ORAL_CAPSULE | Freq: Two times a day (BID) | ORAL | 0 refills | Status: DC
Start: 2020-03-07 — End: 2020-04-15

## 2020-03-07 MED ORDER — POTASSIUM CHLORIDE 20 MEQ PO TBTQ
40 meq | Freq: Once | ORAL | 0 refills | Status: CP
Start: 2020-03-07 — End: ?
  Administered 2020-03-07: 14:00:00 40 meq via ORAL

## 2020-03-12 ENCOUNTER — Encounter: Admit: 2020-03-12 | Discharge: 2020-03-12 | Payer: BC Managed Care – PPO

## 2020-03-12 MED ORDER — OXYCODONE 5 MG PO TAB
5-10 mg | ORAL_TABLET | ORAL | 0 refills | 6.00000 days | Status: DC | PRN
Start: 2020-03-12 — End: 2020-04-15

## 2020-03-12 NOTE — Telephone Encounter
Call received from [facility], she has noticed a small amount of brown yellow drainage and a small hard yellow spot on the incision about the size of a pea.  Returned call, LM.

## 2020-03-27 ENCOUNTER — Encounter: Admit: 2020-03-27 | Discharge: 2020-03-27 | Payer: BC Managed Care – PPO

## 2020-03-27 DIAGNOSIS — S129XXD Fracture of neck, unspecified, subsequent encounter: Secondary | ICD-10-CM

## 2020-03-28 ENCOUNTER — Encounter: Admit: 2020-03-28 | Discharge: 2020-03-28 | Payer: BC Managed Care – PPO

## 2020-03-28 ENCOUNTER — Ambulatory Visit: Admit: 2020-03-28 | Discharge: 2020-03-28 | Payer: BC Managed Care – PPO

## 2020-03-28 DIAGNOSIS — T148XXA Other injury of unspecified body region, initial encounter: Secondary | ICD-10-CM

## 2020-03-28 DIAGNOSIS — R519 Generalized headaches: Secondary | ICD-10-CM

## 2020-03-28 DIAGNOSIS — M069 Rheumatoid arthritis, unspecified: Secondary | ICD-10-CM

## 2020-03-28 DIAGNOSIS — K219 Gastro-esophageal reflux disease without esophagitis: Secondary | ICD-10-CM

## 2020-03-28 DIAGNOSIS — G629 Polyneuropathy, unspecified: Secondary | ICD-10-CM

## 2020-03-28 DIAGNOSIS — B999 Unspecified infectious disease: Secondary | ICD-10-CM

## 2020-03-28 DIAGNOSIS — H18509 Corneal dystrophy: Secondary | ICD-10-CM

## 2020-03-28 DIAGNOSIS — S129XXD Fracture of neck, unspecified, subsequent encounter: Secondary | ICD-10-CM

## 2020-03-28 DIAGNOSIS — K259 Gastric ulcer, unspecified as acute or chronic, without hemorrhage or perforation: Secondary | ICD-10-CM

## 2020-03-28 DIAGNOSIS — D649 Anemia, unspecified: Secondary | ICD-10-CM

## 2020-03-28 DIAGNOSIS — M797 Fibromyalgia: Secondary | ICD-10-CM

## 2020-03-28 DIAGNOSIS — M1990 Unspecified osteoarthritis, unspecified site: Secondary | ICD-10-CM

## 2020-03-28 DIAGNOSIS — L719 Rosacea, unspecified: Secondary | ICD-10-CM

## 2020-03-28 DIAGNOSIS — R112 Nausea with vomiting, unspecified: Secondary | ICD-10-CM

## 2020-03-28 DIAGNOSIS — M2550 Pain in unspecified joint: Secondary | ICD-10-CM

## 2020-03-28 DIAGNOSIS — F329 Major depressive disorder, single episode, unspecified: Secondary | ICD-10-CM

## 2020-03-28 NOTE — Patient Instructions
It was great to see you today. Thank you for choosing to visit [facility] at [employer] [facility].     Your time is important and if you had to wait today, we do apologize. Our goal is to be prompt; however, on occasion, we experience delays during clinic due to unexpected patient needs. We thank you for your patience.     General Instructions:  ? How to reach me: Please send a MyChart message or leave a voicemail on Dr Louie Boston nurse line at [phone].  ? Scheduling: Our scheduling phone number is [phone].  ? How to receive your test results: If you have signed up for MyChart, results are typically viewable within 48 hours. Please note that imaging results will be reviewed during a follow-up visit.  ? How to get a medication refill: Please use MyChart Refill request or contact your pharmacy directly to request medication refills. We require 2 business days notice for refills of controlled substances.  ? Appointment Reminders on your cell phone: Make sure we have your cell phone number, and Text [place] to [phone].  ? Support for many chronic illnesses is available through [facility]: [facility] or 724-627-7249.  ? For urgent questions on nights, weekends or holidays, call the Operator at [phone], and ask for the doctor on call for Orthopedic Spine Surgery.     Thank-you for your visiting with [place] today!        Clinical Nurse Coordinator ? Dr. Mare Ferrari, M.D.  The [facility]  [place], [place] [address]  Phone: [phone] Fax [phone]

## 2020-04-11 ENCOUNTER — Encounter: Admit: 2020-04-11 | Discharge: 2020-04-11 | Payer: BC Managed Care – PPO

## 2020-04-11 MED ORDER — CEPHALEXIN 500 MG PO CAP
500 mg | ORAL_CAPSULE | Freq: Four times a day (QID) | ORAL | 0 refills | Status: AC
Start: 2020-04-11 — End: ?

## 2020-04-11 NOTE — Telephone Encounter
-----   Message from Trenton I. Orvan Falconer sent at 04/11/2020  3:00 PM CDT -----  Regarding: RE: Non-Urgent Medical Question  Contact: [phone]  I haven?t noticed fever or chills. Yes, there is pain and weakness primarily down my right arm, but some weakness on the left side a couple days ago.     Drainage started on Tuesday or Wednesday. It?s not enough to change dressing more than once daily.

## 2020-04-14 ENCOUNTER — Encounter: Admit: 2020-04-14 | Discharge: 2020-04-14 | Payer: BC Managed Care – PPO

## 2020-04-14 NOTE — Telephone Encounter
Drainage seems to be less today-

## 2020-04-15 ENCOUNTER — Encounter: Admit: 2020-04-15 | Discharge: 2020-04-15 | Payer: BC Managed Care – PPO

## 2020-04-15 ENCOUNTER — Ambulatory Visit: Admit: 2020-04-15 | Discharge: 2020-04-15 | Payer: BC Managed Care – PPO

## 2020-04-15 DIAGNOSIS — F329 Major depressive disorder, single episode, unspecified: Secondary | ICD-10-CM

## 2020-04-15 DIAGNOSIS — G629 Polyneuropathy, unspecified: Secondary | ICD-10-CM

## 2020-04-15 DIAGNOSIS — M2550 Pain in unspecified joint: Secondary | ICD-10-CM

## 2020-04-15 DIAGNOSIS — D649 Anemia, unspecified: Secondary | ICD-10-CM

## 2020-04-15 DIAGNOSIS — M069 Rheumatoid arthritis, unspecified: Secondary | ICD-10-CM

## 2020-04-15 DIAGNOSIS — H18509 Corneal dystrophy: Secondary | ICD-10-CM

## 2020-04-15 DIAGNOSIS — M1990 Unspecified osteoarthritis, unspecified site: Secondary | ICD-10-CM

## 2020-04-15 DIAGNOSIS — L719 Rosacea, unspecified: Secondary | ICD-10-CM

## 2020-04-15 DIAGNOSIS — S129XXD Fracture of neck, unspecified, subsequent encounter: Secondary | ICD-10-CM

## 2020-04-15 DIAGNOSIS — K259 Gastric ulcer, unspecified as acute or chronic, without hemorrhage or perforation: Secondary | ICD-10-CM

## 2020-04-15 DIAGNOSIS — T148XXA Other injury of unspecified body region, initial encounter: Secondary | ICD-10-CM

## 2020-04-15 DIAGNOSIS — R112 Nausea with vomiting, unspecified: Secondary | ICD-10-CM

## 2020-04-15 DIAGNOSIS — B999 Unspecified infectious disease: Secondary | ICD-10-CM

## 2020-04-15 DIAGNOSIS — M797 Fibromyalgia: Secondary | ICD-10-CM

## 2020-04-15 DIAGNOSIS — T8130XA Disruption of wound, unspecified, initial encounter: Secondary | ICD-10-CM

## 2020-04-15 DIAGNOSIS — R519 Generalized headaches: Secondary | ICD-10-CM

## 2020-04-15 DIAGNOSIS — K219 Gastro-esophageal reflux disease without esophagitis: Secondary | ICD-10-CM

## 2020-04-15 MED ORDER — CEFAZOLIN INJ 1GM IVP
2 g | Freq: Once | INTRAVENOUS | 0 refills | Status: CN
Start: 2020-04-15 — End: ?

## 2020-04-15 NOTE — Progress Notes
SPINE CENTER CLINIC NOTE      Dictation on: 04/15/2020  4:38 PM by: Charlesetta Garibaldi         I   Review of Systems  Current Outpatient Medications on File Prior to Visit   Medication Sig Dispense Refill   ? acetaminophen (TYLENOL) 325 mg tablet Take two tablets by mouth every 4 hours. 500 tablet 0   ? amitriptyline (ELAVIL) 50 mg tablet Take 75 mg by mouth at bedtime daily.     ? aspirin/acetaminophen/caffeine (EXCEDRIN EXTRA STRENGTH) 250/250/65 mg tab Take 1 tablet by mouth as Needed.     ? buPROPion XL (WELLBUTRIN XL) 150 mg tablet Take 150 mg by mouth daily.     ? cephalexin (KEFLEX) 500 mg capsule Take one capsule by mouth four times daily for 10 days. 40 capsule 0   ? cholecalciferol (VITAMIN D-3) 1,000 units tablet Take 2,000 Units by mouth daily.     ? cyclobenzaprine (FLEXERIL) 10 mg tablet Take 10 mg by mouth three times daily as needed for Muscle Cramps.     ? docusate (COLACE) 100 mg capsule Take one capsule by mouth twice daily. 180 capsule 0   ? doxycycline (VIBRAMYCIN) 100 mg tablet Take 100 mg by mouth twice daily.     ? galcanezumab-gnlm (EMGALITY PEN) 120 mg/mL subcutaneous PEN Inject 120 mg under the skin every 30 days.     ? omeprazole DR (PRILOSEC) 20 mg capsule Take 20 mg by mouth twice daily.     ? oxyCODONE (ROXICODONE) 5 mg tablet Take one tablet to two tablets by mouth every 3 hours as needed 80 tablet 0   ? sucralfate (CARAFATE) 100 mg/mL oral suspension Take 1 g by mouth four times daily.     ? temazepam (RESTORIL) 15 mg capsule Take 15 mg by mouth as Needed.     ? traMADoL (ULTRAM) 50 mg tablet Take 50 mg by mouth as Needed.     ? vitamins, multiple tablet Take 1 tablet by mouth daily.       No current facility-administered medications on file prior to visit.     Allergies   Allergen Reactions   ? Bextra [Valdecoxib] HIVES   ? Penicillins SEE COMMENTS     YEAST INFECTIONS       Vitals:    04/15/20 1630   BP: (!) (P) 138/92   Pulse: (P) 95   SpO2: (P) 97%   Weight: 91.6 kg (202 lb)   Height: 157.5 cm (62)   PainSc: Zero     Oswestry Total Score:: 38  Pain Score: Zero  Body mass index is 36.95 kg/m?Marland Kitchen

## 2020-04-16 ENCOUNTER — Encounter: Admit: 2020-04-16 | Discharge: 2020-04-16 | Payer: BC Managed Care – PPO

## 2020-04-22 ENCOUNTER — Encounter: Admit: 2020-04-22 | Discharge: 2020-04-22 | Payer: BC Managed Care – PPO

## 2020-04-24 ENCOUNTER — Encounter: Admit: 2020-04-24 | Discharge: 2020-04-24 | Payer: BC Managed Care – PPO

## 2020-05-01 ENCOUNTER — Encounter: Admit: 2020-05-01 | Discharge: 2020-05-01 | Payer: BC Managed Care – PPO

## 2020-05-01 DIAGNOSIS — S129XXD Fracture of neck, unspecified, subsequent encounter: Secondary | ICD-10-CM

## 2020-05-01 NOTE — Patient Instructions
It was great to see you today. Thank you for choosing to visit [facility] at [employer] [facility].     Your time is important and if you had to wait today, we do apologize. Our goal is to be prompt; however, on occasion, we experience delays during clinic due to unexpected patient needs. We thank you for your patience.     General Instructions:  ? How to reach me: Please send a MyChart message or leave a voicemail on Dr Louie Boston nurse line at [phone].  ? Scheduling: Our scheduling phone number is [phone].  ? How to receive your test results: If you have signed up for MyChart, results are typically viewable within 48 hours. Please note that imaging results will be reviewed during a follow-up visit.  ? How to get a medication refill: Please use MyChart Refill request or contact your pharmacy directly to request medication refills. We require 2 business days notice for refills of controlled substances.  ? Appointment Reminders on your cell phone: Make sure we have your cell phone number, and Text [place] to [phone].  ? Support for many chronic illnesses is available through [facility]: [facility] or 724-627-7249.  ? For urgent questions on nights, weekends or holidays, call the Operator at [phone], and ask for the doctor on call for Orthopedic Spine Surgery.     Thank-you for your visiting with [place] today!        Clinical Nurse Coordinator ? Dr. Mare Ferrari, M.D.  The [facility]  [place], [place] [address]  Phone: [phone] Fax [phone]

## 2020-05-02 ENCOUNTER — Ambulatory Visit: Admit: 2020-05-02 | Discharge: 2020-05-02 | Payer: BC Managed Care – PPO

## 2020-05-02 ENCOUNTER — Encounter: Admit: 2020-05-02 | Discharge: 2020-05-02 | Payer: BC Managed Care – PPO

## 2020-05-02 DIAGNOSIS — K219 Gastro-esophageal reflux disease without esophagitis: Secondary | ICD-10-CM

## 2020-05-02 DIAGNOSIS — S129XXD Fracture of neck, unspecified, subsequent encounter: Secondary | ICD-10-CM

## 2020-05-02 DIAGNOSIS — M1990 Unspecified osteoarthritis, unspecified site: Secondary | ICD-10-CM

## 2020-05-02 DIAGNOSIS — K259 Gastric ulcer, unspecified as acute or chronic, without hemorrhage or perforation: Secondary | ICD-10-CM

## 2020-05-02 DIAGNOSIS — R519 Generalized headaches: Secondary | ICD-10-CM

## 2020-05-02 DIAGNOSIS — G629 Polyneuropathy, unspecified: Secondary | ICD-10-CM

## 2020-05-02 DIAGNOSIS — B999 Unspecified infectious disease: Secondary | ICD-10-CM

## 2020-05-02 DIAGNOSIS — D649 Anemia, unspecified: Secondary | ICD-10-CM

## 2020-05-02 DIAGNOSIS — M069 Rheumatoid arthritis, unspecified: Secondary | ICD-10-CM

## 2020-05-02 DIAGNOSIS — M2550 Pain in unspecified joint: Secondary | ICD-10-CM

## 2020-05-02 DIAGNOSIS — M797 Fibromyalgia: Secondary | ICD-10-CM

## 2020-05-02 DIAGNOSIS — H18509 Corneal dystrophy: Secondary | ICD-10-CM

## 2020-05-02 DIAGNOSIS — L719 Rosacea, unspecified: Secondary | ICD-10-CM

## 2020-05-02 DIAGNOSIS — F329 Major depressive disorder, single episode, unspecified: Secondary | ICD-10-CM

## 2020-05-02 DIAGNOSIS — R112 Nausea with vomiting, unspecified: Secondary | ICD-10-CM

## 2020-05-02 DIAGNOSIS — T148XXA Other injury of unspecified body region, initial encounter: Secondary | ICD-10-CM

## 2020-05-02 NOTE — Progress Notes
SPINE CENTER CLINIC NOTE       SUBJECTIVE: Madeline Barry is in today for her 6-month follow-up cervical 6-7 posterior cervical fusion which was March 06, 2019 this year.  She did have one episode of some wound drainage which was concerning for a suture abscess which cleared up on some oral antibiotics. She did restart working last week. Since this, she has noticed some increased swelling in the incision at night and some arm pain and tingling which seems to be worsening for the past week.     PHYSICAL EXAM: full strength in bil UE with one time testing. She is reporting dysesthesias as mentioned above. Her incision is well healed.     IMPRESSION: Normal post-operative course, well healing posterior spinal fusion C6-7.     PLAN:   We will plan to see her back in 4 months. She can continue the tramadol as needed. We discussed trying ice to the back of the neck as needed at night. The pain and tingling in her arm is not unexpected and should continue to improve as she continues to heal.     I   Review of Systems  Current Outpatient Medications on File Prior to Visit   Medication Sig Dispense Refill   ? amitriptyline (ELAVIL) 50 mg tablet Take 75 mg by mouth at bedtime daily.     ? buPROPion XL (WELLBUTRIN XL) 150 mg tablet Take 150 mg by mouth daily.     ? cholecalciferol (VITAMIN D-3) 1,000 units tablet Take 2,000 Units by mouth daily.     ? cyclobenzaprine (FLEXERIL) 10 mg tablet Take 10 mg by mouth three times daily as needed for Muscle Cramps.     ? doxycycline (VIBRAMYCIN) 100 mg tablet Take 100 mg by mouth twice daily.     ? galcanezumab-gnlm (EMGALITY PEN) 120 mg/mL subcutaneous PEN Inject 120 mg under the skin every 30 days.     ? temazepam (RESTORIL) 15 mg capsule Take 15 mg by mouth as Needed.     ? traMADoL (ULTRAM) 50 mg tablet Take 50 mg by mouth as Needed.     ? vitamins, multiple tablet Take 1 tablet by mouth daily.       No current facility-administered medications on file prior to visit.     Allergies Allergen Reactions   ? Bextra [Valdecoxib] HIVES   ? Penicillins SEE COMMENTS     YEAST INFECTIONS       Vitals:    05/02/20 1135   BP: (!) 145/93   Pulse: 95   SpO2: 98%   Weight: 91.6 kg (202 lb)   Height: 157.5 cm (62)   PainSc: Three     Oswestry Total Score:: 28  Pain Score: Three  Body mass index is 36.95 kg/m?Marland Kitchen

## 2020-09-01 ENCOUNTER — Encounter: Admit: 2020-09-01 | Discharge: 2020-09-01 | Payer: BC Managed Care – PPO

## 2020-09-01 DIAGNOSIS — S129XXD Fracture of neck, unspecified, subsequent encounter: Secondary | ICD-10-CM

## 2020-09-01 NOTE — Patient Instructions
Vasili Fok MSN, RN  Clinical Nurse Coordinator  Dr. Douglas Burton, M.D.  [facility]  [place], [place] [address]  Nurse Phone Number: [phone] Fax [phone]  Appointment line [phone]      It was great to see you today. Thank you for choosing to visit [facility].     Your time is important and if you had to wait today, we do apologize. Our goal is to be prompt; however, on occasion, we experience delays during clinic due to unexpected patient needs. We thank you for your patience.     General Instructions:   How to reach me: Please send a MyChart message or leave a voicemail on Dr Burton's nurse line at [phone].   Scheduling: Our scheduling phone number is [phone].   If you choose to have a test or procedure outside of [facility] please contact us with the fax number of your chose provider so we can fax your referral to them if necessary.  We do not prior authorize testing or procedures performed outside of [facility].     How to receive your test results: If you have signed up for MyChart, results are typically viewable within 48 hours. Please note that imaging results will be reviewed during a follow-up visit.   How to get a medication refill: Please use MyChart Refill request or contact your pharmacy directly to request medication refills. We require 2 business days notice for refills of controlled substances.   Appointment Reminders on your cell phone: Make sure we have your cell phone number, and Text [place] to [phone].   Support for many chronic illnesses is available through [facility]: [email or web address] or [phone].   For urgent questions on nights, weekends or holidays, call the Operator at [phone], and ask for the doctor on call for Orthopedic Spine Surgery.     Thank-you for your visiting with us today!

## 2020-09-02 ENCOUNTER — Ambulatory Visit: Admit: 2020-09-02 | Discharge: 2020-09-02 | Payer: BC Managed Care – PPO

## 2020-09-02 ENCOUNTER — Encounter: Admit: 2020-09-02 | Discharge: 2020-09-02 | Payer: BC Managed Care – PPO

## 2020-09-02 DIAGNOSIS — M1990 Unspecified osteoarthritis, unspecified site: Principal | ICD-10-CM

## 2020-09-02 DIAGNOSIS — H18509 Corneal dystrophy: Secondary | ICD-10-CM

## 2020-09-02 DIAGNOSIS — F32A Depression: Secondary | ICD-10-CM

## 2020-09-02 DIAGNOSIS — T148XXA Other injury of unspecified body region, initial encounter: Secondary | ICD-10-CM

## 2020-09-02 DIAGNOSIS — M069 Rheumatoid arthritis, unspecified: Secondary | ICD-10-CM

## 2020-09-02 DIAGNOSIS — D649 Anemia, unspecified: Secondary | ICD-10-CM

## 2020-09-02 DIAGNOSIS — S129XXD Fracture of neck, unspecified, subsequent encounter: Secondary | ICD-10-CM

## 2020-09-02 DIAGNOSIS — R112 Nausea with vomiting, unspecified: Secondary | ICD-10-CM

## 2020-09-02 DIAGNOSIS — K219 Gastro-esophageal reflux disease without esophagitis: Secondary | ICD-10-CM

## 2020-09-02 DIAGNOSIS — G629 Polyneuropathy, unspecified: Secondary | ICD-10-CM

## 2020-09-02 DIAGNOSIS — M2550 Pain in unspecified joint: Secondary | ICD-10-CM

## 2020-09-02 DIAGNOSIS — K259 Gastric ulcer, unspecified as acute or chronic, without hemorrhage or perforation: Secondary | ICD-10-CM

## 2020-09-02 DIAGNOSIS — L719 Rosacea, unspecified: Secondary | ICD-10-CM

## 2020-09-02 DIAGNOSIS — M797 Fibromyalgia: Secondary | ICD-10-CM

## 2020-09-02 DIAGNOSIS — B999 Unspecified infectious disease: Secondary | ICD-10-CM

## 2020-09-02 DIAGNOSIS — R519 Generalized headaches: Secondary | ICD-10-CM

## 2020-09-02 NOTE — Progress Notes
SPINE CENTER CLINIC NOTE        Dictation on: 09/02/2020 10:25 AM by: Patrcia Dolly [DBURTON]         I   Review of Systems  Current Outpatient Medications on File Prior to Visit   Medication Sig Dispense Refill    amitriptyline (ELAVIL) 50 mg tablet Take 75 mg by mouth at bedtime daily.      buPROPion XL (WELLBUTRIN XL) 150 mg tablet Take 150 mg by mouth daily.      cholecalciferol (VITAMIN D-3) 1,000 units tablet Take 2,000 Units by mouth daily.      cyclobenzaprine (FLEXERIL) 10 mg tablet Take 10 mg by mouth three times daily as needed for Muscle Cramps.      doxycycline (VIBRAMYCIN) 100 mg tablet Take 100 mg by mouth twice daily.      galcanezumab-gnlm (EMGALITY PEN) 120 mg/mL subcutaneous PEN Inject 120 mg under the skin every 30 days.      temazepam (RESTORIL) 15 mg capsule Take 15 mg by mouth as Needed.      traMADoL (ULTRAM) 50 mg tablet Take 50 mg by mouth as Needed.      vitamins, multiple tablet Take 1 tablet by mouth daily.       No current facility-administered medications on file prior to visit.     Allergies   Allergen Reactions    Bextra [Valdecoxib] HIVES    Penicillins SEE COMMENTS     YEAST INFECTIONS       Vitals:    09/02/20 1012   BP: (!) 143/83   Pulse: 87   SpO2: 97%   Weight: 91.6 kg (202 lb)   Height: 157.5 cm (62")   PainSc: Four     Oswestry Total Score:: 24  Pain Score: Four  Body mass index is 36.95 kg/m.

## 2020-11-17 ENCOUNTER — Encounter: Admit: 2020-11-17 | Discharge: 2020-11-17 | Payer: BC Managed Care – PPO

## 2020-11-24 ENCOUNTER — Encounter: Admit: 2020-11-24 | Discharge: 2020-11-24 | Payer: BC Managed Care – PPO

## 2020-11-24 DIAGNOSIS — S129XXD Fracture of neck, unspecified, subsequent encounter: Secondary | ICD-10-CM

## 2020-11-24 DIAGNOSIS — Z981 Arthrodesis status: Secondary | ICD-10-CM

## 2020-11-24 NOTE — Patient Instructions
Glorie Dowlen MSN, RN  Clinical Nurse Coordinator  Dr. Douglas Burton, M.D.  [facility]  [place], [place] [address]  Nurse Phone Number: [phone] Fax [phone]  Appointment line [phone]      It was great to see you today. Thank you for choosing to visit [facility].     Your time is important and if you had to wait today, we do apologize. Our goal is to be prompt; however, on occasion, we experience delays during clinic due to unexpected patient needs. We thank you for your patience.     General Instructions:   How to reach me: Please send a MyChart message or leave a voicemail on Dr Burton's nurse line at [phone].   Scheduling: Our scheduling phone number is [phone].   If you choose to have a test or procedure outside of [facility] please contact us with the fax number of your chose provider so we can fax your referral to them if necessary.  We do not prior authorize testing or procedures performed outside of [facility].     How to receive your test results: If you have signed up for MyChart, results are typically viewable within 48 hours. Please note that imaging results will be reviewed during a follow-up visit.   How to get a medication refill: Please use MyChart Refill request or contact your pharmacy directly to request medication refills. We require 2 business days notice for refills of controlled substances.   Appointment Reminders on your cell phone: Make sure we have your cell phone number, and Text [place] to [phone].   Support for many chronic illnesses is available through [facility]: [email or web address] or [phone].   For urgent questions on nights, weekends or holidays, call the Operator at [phone], and ask for the doctor on call for Orthopedic Spine Surgery.     Thank-you for your visiting with us today!

## 2020-11-25 ENCOUNTER — Encounter: Admit: 2020-11-25 | Discharge: 2020-11-25 | Payer: BC Managed Care – PPO

## 2020-11-25 ENCOUNTER — Ambulatory Visit: Admit: 2020-11-25 | Discharge: 2020-11-25 | Payer: BC Managed Care – PPO

## 2020-11-25 DIAGNOSIS — R112 Nausea with vomiting, unspecified: Secondary | ICD-10-CM

## 2020-11-25 DIAGNOSIS — M1990 Unspecified osteoarthritis, unspecified site: Secondary | ICD-10-CM

## 2020-11-25 DIAGNOSIS — M2550 Pain in unspecified joint: Secondary | ICD-10-CM

## 2020-11-25 DIAGNOSIS — M797 Fibromyalgia: Secondary | ICD-10-CM

## 2020-11-25 DIAGNOSIS — R519 Generalized headaches: Secondary | ICD-10-CM

## 2020-11-25 DIAGNOSIS — M069 Rheumatoid arthritis, unspecified: Secondary | ICD-10-CM

## 2020-11-25 DIAGNOSIS — T148XXA Other injury of unspecified body region, initial encounter: Secondary | ICD-10-CM

## 2020-11-25 DIAGNOSIS — S129XXD Fracture of neck, unspecified, subsequent encounter: Secondary | ICD-10-CM

## 2020-11-25 DIAGNOSIS — G629 Polyneuropathy, unspecified: Secondary | ICD-10-CM

## 2020-11-25 DIAGNOSIS — B999 Unspecified infectious disease: Secondary | ICD-10-CM

## 2020-11-25 DIAGNOSIS — Z981 Arthrodesis status: Secondary | ICD-10-CM

## 2020-11-25 DIAGNOSIS — D649 Anemia, unspecified: Secondary | ICD-10-CM

## 2020-11-25 DIAGNOSIS — F32A Depression: Secondary | ICD-10-CM

## 2020-11-25 DIAGNOSIS — L719 Rosacea, unspecified: Secondary | ICD-10-CM

## 2020-11-25 DIAGNOSIS — K259 Gastric ulcer, unspecified as acute or chronic, without hemorrhage or perforation: Secondary | ICD-10-CM

## 2020-11-25 DIAGNOSIS — H18509 Corneal dystrophy: Secondary | ICD-10-CM

## 2020-11-25 DIAGNOSIS — M542 Cervicalgia: Secondary | ICD-10-CM

## 2020-11-25 DIAGNOSIS — K219 Gastro-esophageal reflux disease without esophagitis: Secondary | ICD-10-CM

## 2020-11-25 NOTE — Telephone Encounter
Faxed PT order to [facility]   Fax [phone]

## 2021-03-02 ENCOUNTER — Encounter: Admit: 2021-03-02 | Discharge: 2021-03-02 | Payer: BC Managed Care – PPO

## 2021-03-02 DIAGNOSIS — S129XXD Fracture of neck, unspecified, subsequent encounter: Secondary | ICD-10-CM

## 2021-03-02 DIAGNOSIS — M542 Cervicalgia: Secondary | ICD-10-CM

## 2021-03-02 NOTE — Patient Instructions
Madeline Gilkerson MSN, RN  Clinical Nurse Coordinator - Dr. Douglas Burton, M.D.  [facility]  [place], [place] [address]  Nurse Phone Number: [phone] Fax [phone]  Appointment line [phone]      It was great to see you today. Thank you for choosing to visit [facility] at [facility].     Your time is important and if you had to wait today, we do apologize. Our goal is to be prompt; however, on occasion, we experience delays during clinic due to unexpected patient needs. We thank you for your patience.     General Instructions:   How to reach me: Please send a MyChart message or leave a voicemail on Dr Burton's nurse line at [phone].   Scheduling: Our scheduling phone number is [phone].   If you choose to have a test or procedure outside of [facility] please contact us with the fax number of your chosen provider so we can fax your referral to them if necessary.  We do not prior authorize testing or procedures performed outside of [facility].     How to receive your test results: If you have signed up for MyChart, results are typically viewable within 48 hours. Please note that imaging results will be reviewed during a follow-up visit.   How to get a medication refill: Please use MyChart Refill request or contact your pharmacy directly to request medication refills. We require 2 business days notice for refills of controlled substances.   Communication preferences can be managed in MyChart to ensure you receive important appointment notifications   Support for many chronic illnesses is available through [facility]: [email or web address] or [phone].   For urgent questions on nights, weekends or holidays, call the Operator at [phone], and ask for the doctor on call for Orthopedic Spine Surgery.     Thank-you for your visiting with us today!

## 2021-03-03 ENCOUNTER — Ambulatory Visit: Admit: 2021-03-03 | Discharge: 2021-03-03 | Payer: BC Managed Care – PPO

## 2021-03-03 ENCOUNTER — Encounter: Admit: 2021-03-03 | Discharge: 2021-03-03 | Payer: BC Managed Care – PPO

## 2021-03-03 DIAGNOSIS — M1990 Unspecified osteoarthritis, unspecified site: Secondary | ICD-10-CM

## 2021-03-03 DIAGNOSIS — S129XXD Fracture of neck, unspecified, subsequent encounter: Secondary | ICD-10-CM

## 2021-03-03 DIAGNOSIS — F32A Depression: Secondary | ICD-10-CM

## 2021-03-03 DIAGNOSIS — G629 Polyneuropathy, unspecified: Secondary | ICD-10-CM

## 2021-03-03 DIAGNOSIS — M542 Cervicalgia: Secondary | ICD-10-CM

## 2021-03-03 DIAGNOSIS — M797 Fibromyalgia: Secondary | ICD-10-CM

## 2021-03-03 DIAGNOSIS — M069 Rheumatoid arthritis, unspecified: Secondary | ICD-10-CM

## 2021-03-03 DIAGNOSIS — K219 Gastro-esophageal reflux disease without esophagitis: Secondary | ICD-10-CM

## 2021-03-03 DIAGNOSIS — K259 Gastric ulcer, unspecified as acute or chronic, without hemorrhage or perforation: Secondary | ICD-10-CM

## 2021-03-03 DIAGNOSIS — T148XXA Other injury of unspecified body region, initial encounter: Secondary | ICD-10-CM

## 2021-03-03 DIAGNOSIS — B999 Unspecified infectious disease: Secondary | ICD-10-CM

## 2021-03-03 DIAGNOSIS — R519 Generalized headaches: Secondary | ICD-10-CM

## 2021-03-03 DIAGNOSIS — M2550 Pain in unspecified joint: Secondary | ICD-10-CM

## 2021-03-03 DIAGNOSIS — R112 Nausea with vomiting, unspecified: Secondary | ICD-10-CM

## 2021-03-03 DIAGNOSIS — H18509 Corneal dystrophy: Secondary | ICD-10-CM

## 2021-03-03 DIAGNOSIS — D649 Anemia, unspecified: Secondary | ICD-10-CM

## 2021-03-03 DIAGNOSIS — L719 Rosacea, unspecified: Secondary | ICD-10-CM

## 2022-02-15 ENCOUNTER — Encounter: Admit: 2022-02-15 | Discharge: 2022-02-15 | Payer: BC Managed Care – PPO

## 2022-03-23 IMAGING — MR MR thoracic spine wo con
1 series · 10 of 10 positions shown · non-contrast
Comparison: MRI lumbar spine 01/07/2022.

REASON FOR EXAM: M [age] intervertebral disc disorder with radiculopathy. Fall in March, continued
back pain.
TECHNIQUE: Multiplanar multisequence noncontrast-enhanced MRI of the thoracic spine was obtained.

[Series 1101: T2 · 10 of 30 frames shown]
[frame 1/30]
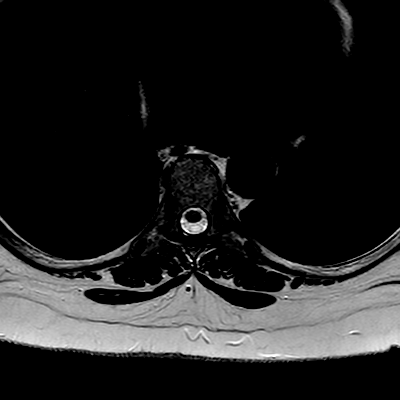
[frame 4/30]
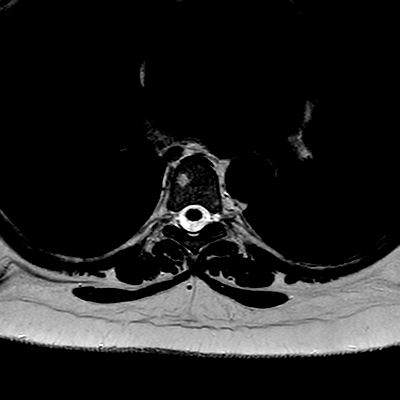
[frame 7/30]
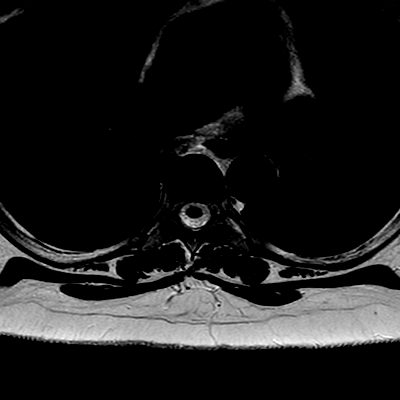
[frame 10/30]
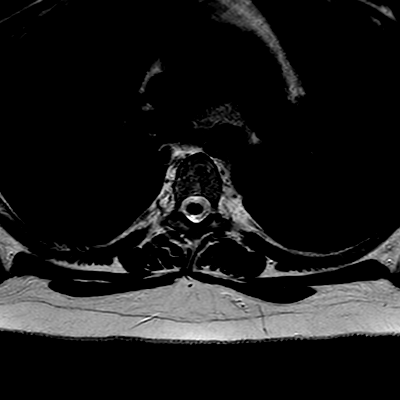
[frame 13/30]
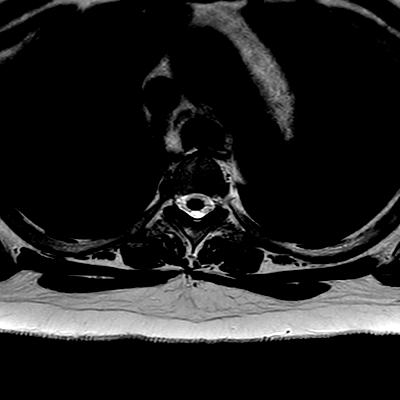
[frame 17/30]
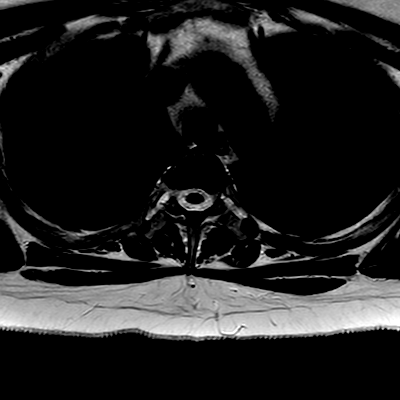
[frame 20/30]
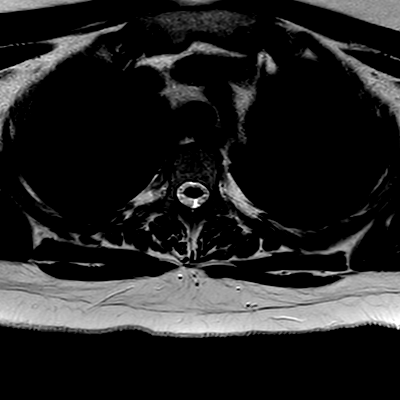
[frame 23/30]
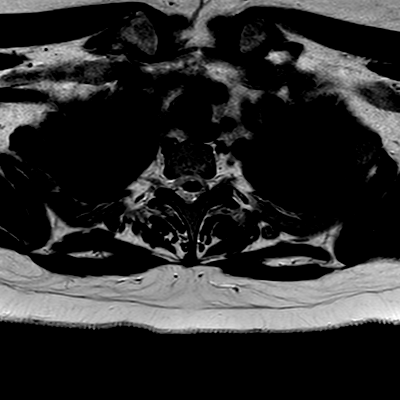
[frame 26/30]
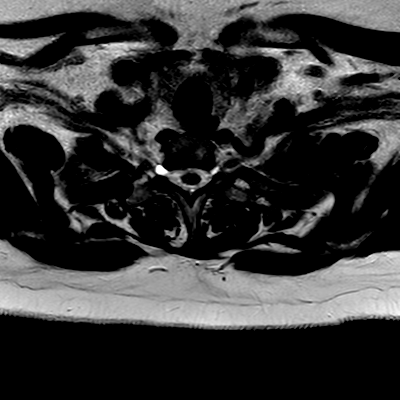
[frame 30/30]
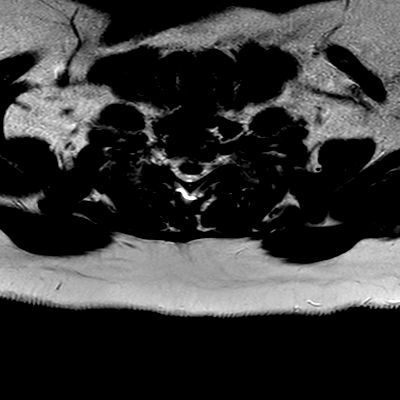

[10 of 10 positions shown; findings below may reference images not displayed]

FINDINGS: There are 12 rib-bearing thoracic vertebral bodies. There is normal anatomic alignment of the
thoracic spine.
There is a subacute compression fracture of the T11 vertebral body with approximately 20-25%
anterior height loss.
The remainder of the thoracic vertebral body heights are within normal limits.
There is subtle edema in the T10 spinous process (image 8, series 2882). Edema is also seen in the
prevertebral and paraspinal soft tissues at T10 and T11.
No focal bony lesions are seen. The disk heights are within normal limits. The visualized spinal
cord is normal in caliber and intrinsic signal. There is no hemorrhage within the cord. There is no
cord compression. No large pre or paravertebral masses are seen. No epidural hematoma is seen.
The ligamentous structures are intact.
Minimal bulging discs are present at T1-T2, T2-T3, T10 and T11, and T11-T12. Small central disc
protrusion is present at T12-L1. Mild right neuroforaminal narrowing is present at T5-T6. No other
neuroforaminal stenosis. No spinal canal stenosis is present.
IMPRESSION: 1. Subacute compression fracture of the T11 vertebral body with approximately 20-25% anterior height
loss.
2. Subtle edema in the T10 spinous process, may represent stress-related changes.
3. Prevertebral or paraspinal soft tissue edema at T10-T11.
4. Intact ligamentous structures of the thoracic spine.
5. No epidural hematoma or cord compression.
6. Mild degenerative changes of the upper and lower thoracic spine as described. No spinal canal
stenosis.

## 2022-03-23 IMAGING — MR MR cervical spine wo con
1 series · 9 of 9 positions shown · non-contrast
Comparison: None

REASON FOR EXAM: M [age], cervicalgia. Fall in March, continued pain. Initial encounter.
TECHNIQUE: Multiplanar multisequence noncontrast-enhanced MRI of the cervical spine was obtained.

[Series 101: survey · 9 of 9 frames shown]
[frame 1/9]
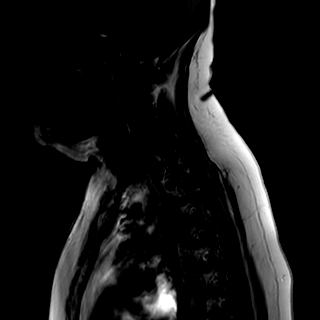
[frame 2/9]
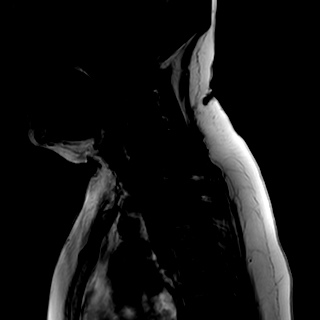
[frame 3/9]
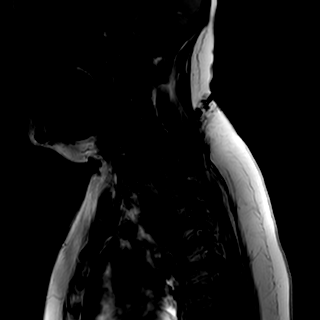
[frame 4/9]
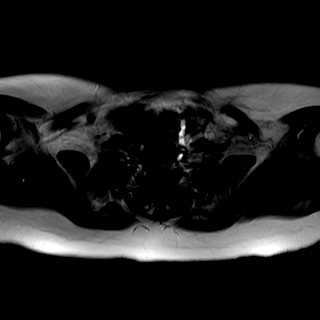
[frame 5/9]
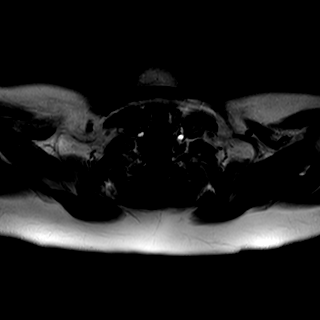
[frame 6/9]
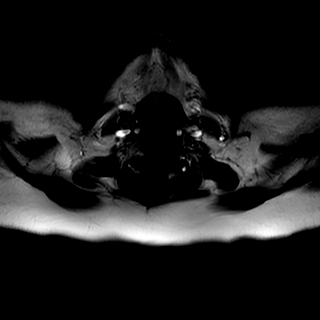
[frame 7/9]
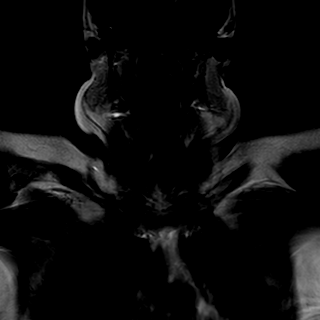
[frame 8/9]
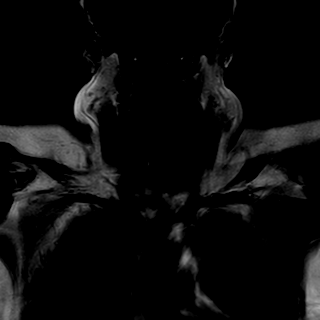
[frame 9/9]
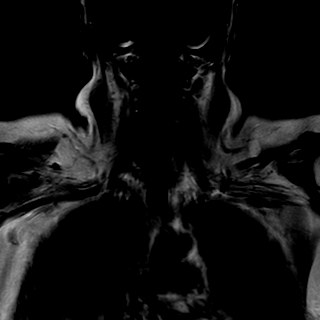

[9 of 9 positions shown; findings below may reference images not displayed]

FINDINGS: There is straightening of normal cervical lordosis with trace anterolisthesis of C5 on C6. No facet
dislocation is seen. Anterior lumbar interbody fusion changes are present at C6-C7, resulting in
regional metallic artifact. Laminectomy changes also present at C6.
The visualized vertebral bodies demonstrate normal height without acute fracture or dislocation.
Modic type II endplate degenerative changes are present at the inferior endplate of T1. Bone marrow
signal is otherwise within normal limits. No focal bony lesion is identified.
Disc space loss is present at C5-C6 and C7-T1.
The prevertebral soft tissue thickness is within normal limits. Partially empty sella is noted, a
normal anatomic variant. The craniocervical junction is otherwise unremarkable. There is no
tonsillar herniation.
No ligamentous injury is seen. There is no epidural hematoma. The visualized spinal cord has a
normal appearance. There is no evidence of cord compression.
The axial images demonstrate:
C2-C3: No disc bulge or herniation. Facet arthropathy. No spinal canal or neuroforaminal stenosis.
C3-C4: No disc bulge or herniation. Right uncovertebral osteophytes and facet arthropathy result in
mild right neuroforaminal narrowing. No left neuroforaminal or spinal canal stenosis.
C4-C5: No disc bulge or herniation. Asymmetric right facet arthropathy results in minimal right
neuroforaminal narrowing. No left neuroforaminal or spinal canal stenosis.
C5-C6: Minimal bulging annulus. No spinal canal or neuroforaminal stenosis.
C6-C7: Discectomy and posterior decompression. No spinal canal or neuroforaminal stenosis.
C7-T1: Minimal central/left paracentral disc protrusion and facet arthropathy. No spinal canal or
neuroforaminal stenosis.
IMPRESSION: 1. Postoperative changes of the cervical spine at C6-C7.
2. Mild multilevel degenerative changes of the cervical spine as described. No cord compression.
3. No acute fracture or ligamentous injury in the cervical spine.

## 2022-03-24 ENCOUNTER — Encounter: Admit: 2022-03-24 | Discharge: 2022-03-24 | Payer: 59

## 2022-03-24 DIAGNOSIS — Z981 Arthrodesis status: Secondary | ICD-10-CM

## 2022-03-24 DIAGNOSIS — M542 Cervicalgia: Secondary | ICD-10-CM

## 2022-03-30 ENCOUNTER — Encounter: Admit: 2022-03-30 | Discharge: 2022-03-30 | Payer: 59

## 2022-04-02 ENCOUNTER — Encounter: Admit: 2022-04-02 | Discharge: 2022-04-02 | Payer: 59

## 2022-04-02 ENCOUNTER — Ambulatory Visit: Admit: 2022-04-02 | Discharge: 2022-04-02 | Payer: 59

## 2022-04-02 DIAGNOSIS — M069 Rheumatoid arthritis, unspecified: Secondary | ICD-10-CM

## 2022-04-02 DIAGNOSIS — R112 Nausea with vomiting, unspecified: Secondary | ICD-10-CM

## 2022-04-02 DIAGNOSIS — H18509 Corneal dystrophy: Secondary | ICD-10-CM

## 2022-04-02 DIAGNOSIS — S22080A Wedge compression fracture of T11-T12 vertebra, initial encounter for closed fracture: Secondary | ICD-10-CM

## 2022-04-02 DIAGNOSIS — K219 Gastro-esophageal reflux disease without esophagitis: Secondary | ICD-10-CM

## 2022-04-02 DIAGNOSIS — M1990 Unspecified osteoarthritis, unspecified site: Secondary | ICD-10-CM

## 2022-04-02 DIAGNOSIS — R519 Generalized headaches: Secondary | ICD-10-CM

## 2022-04-02 DIAGNOSIS — B999 Unspecified infectious disease: Secondary | ICD-10-CM

## 2022-04-02 DIAGNOSIS — M2550 Pain in unspecified joint: Secondary | ICD-10-CM

## 2022-04-02 DIAGNOSIS — M797 Fibromyalgia: Secondary | ICD-10-CM

## 2022-04-02 DIAGNOSIS — T148XXA Other injury of unspecified body region, initial encounter: Secondary | ICD-10-CM

## 2022-04-02 DIAGNOSIS — K259 Gastric ulcer, unspecified as acute or chronic, without hemorrhage or perforation: Secondary | ICD-10-CM

## 2022-04-02 DIAGNOSIS — F32A Depression: Secondary | ICD-10-CM

## 2022-04-02 DIAGNOSIS — G629 Polyneuropathy, unspecified: Secondary | ICD-10-CM

## 2022-04-02 DIAGNOSIS — Z981 Arthrodesis status: Secondary | ICD-10-CM

## 2022-04-02 DIAGNOSIS — L719 Rosacea, unspecified: Secondary | ICD-10-CM

## 2022-04-02 DIAGNOSIS — D649 Anemia, unspecified: Secondary | ICD-10-CM

## 2022-04-02 DIAGNOSIS — M542 Cervicalgia: Secondary | ICD-10-CM

## 2022-05-29 ENCOUNTER — Encounter: Admit: 2022-05-29 | Discharge: 2022-05-29 | Payer: 59

## 2022-10-19 ENCOUNTER — Encounter: Admit: 2022-10-19 | Discharge: 2022-10-19 | Payer: 59

## 2022-10-29 ENCOUNTER — Encounter: Admit: 2022-10-29 | Discharge: 2022-10-29 | Payer: 59

## 2022-10-29 DIAGNOSIS — S22080A Wedge compression fracture of T11-T12 vertebra, initial encounter for closed fracture: Secondary | ICD-10-CM

## 2022-11-05 ENCOUNTER — Ambulatory Visit: Admit: 2022-11-05 | Discharge: 2022-11-05 | Payer: BC Managed Care – PPO

## 2022-11-05 ENCOUNTER — Encounter: Admit: 2022-11-05 | Discharge: 2022-11-05 | Payer: BC Managed Care – PPO

## 2022-11-05 DIAGNOSIS — S22080A Wedge compression fracture of T11-T12 vertebra, initial encounter for closed fracture: Secondary | ICD-10-CM

## 2022-11-05 DIAGNOSIS — M069 Rheumatoid arthritis, unspecified: Secondary | ICD-10-CM

## 2022-11-05 DIAGNOSIS — L719 Rosacea, unspecified: Secondary | ICD-10-CM

## 2022-11-05 DIAGNOSIS — H18509 Corneal dystrophy: Secondary | ICD-10-CM

## 2022-11-05 DIAGNOSIS — F419 Anxiety disorder, unspecified: Secondary | ICD-10-CM

## 2022-11-05 DIAGNOSIS — C801 Malignant (primary) neoplasm, unspecified: Secondary | ICD-10-CM

## 2022-11-05 DIAGNOSIS — G971 Other reaction to spinal and lumbar puncture: Secondary | ICD-10-CM

## 2022-11-05 DIAGNOSIS — K219 Gastro-esophageal reflux disease without esophagitis: Secondary | ICD-10-CM

## 2022-11-05 DIAGNOSIS — R112 Nausea with vomiting, unspecified: Secondary | ICD-10-CM

## 2022-11-05 DIAGNOSIS — R519 Generalized headaches: Secondary | ICD-10-CM

## 2022-11-05 DIAGNOSIS — M797 Fibromyalgia: Secondary | ICD-10-CM

## 2022-11-05 DIAGNOSIS — K259 Gastric ulcer, unspecified as acute or chronic, without hemorrhage or perforation: Secondary | ICD-10-CM

## 2022-11-05 DIAGNOSIS — D649 Anemia, unspecified: Secondary | ICD-10-CM

## 2022-11-05 DIAGNOSIS — M2550 Pain in unspecified joint: Secondary | ICD-10-CM

## 2022-11-05 DIAGNOSIS — T148XXA Other injury of unspecified body region, initial encounter: Secondary | ICD-10-CM

## 2022-11-05 DIAGNOSIS — G629 Polyneuropathy, unspecified: Secondary | ICD-10-CM

## 2022-11-05 DIAGNOSIS — F32A Depression: Secondary | ICD-10-CM

## 2022-11-05 DIAGNOSIS — M1990 Unspecified osteoarthritis, unspecified site: Secondary | ICD-10-CM

## 2022-11-05 DIAGNOSIS — B999 Unspecified infectious disease: Secondary | ICD-10-CM

## 2022-11-08 ENCOUNTER — Encounter: Admit: 2022-11-08 | Discharge: 2022-11-08 | Payer: BC Managed Care – PPO

## 2022-11-11 ENCOUNTER — Ambulatory Visit: Admit: 2022-11-11 | Discharge: 2022-11-11 | Payer: BC Managed Care – PPO

## 2022-11-11 ENCOUNTER — Encounter: Admit: 2022-11-11 | Discharge: 2022-11-11 | Payer: BC Managed Care – PPO

## 2022-11-11 DIAGNOSIS — M1990 Unspecified osteoarthritis, unspecified site: Secondary | ICD-10-CM

## 2022-11-11 DIAGNOSIS — H18509 Corneal dystrophy: Secondary | ICD-10-CM

## 2022-11-11 DIAGNOSIS — K219 Gastro-esophageal reflux disease without esophagitis: Secondary | ICD-10-CM

## 2022-11-11 DIAGNOSIS — M069 Rheumatoid arthritis, unspecified: Secondary | ICD-10-CM

## 2022-11-11 DIAGNOSIS — M797 Fibromyalgia: Secondary | ICD-10-CM

## 2022-11-11 DIAGNOSIS — S22080A Wedge compression fracture of T11-T12 vertebra, initial encounter for closed fracture: Secondary | ICD-10-CM

## 2022-11-11 DIAGNOSIS — F32A Depression: Secondary | ICD-10-CM

## 2022-11-11 DIAGNOSIS — L719 Rosacea, unspecified: Secondary | ICD-10-CM

## 2022-11-11 DIAGNOSIS — T148XXA Other injury of unspecified body region, initial encounter: Secondary | ICD-10-CM

## 2022-11-11 DIAGNOSIS — G971 Other reaction to spinal and lumbar puncture: Secondary | ICD-10-CM

## 2022-11-11 DIAGNOSIS — R112 Nausea with vomiting, unspecified: Secondary | ICD-10-CM

## 2022-11-11 DIAGNOSIS — M2550 Pain in unspecified joint: Secondary | ICD-10-CM

## 2022-11-11 DIAGNOSIS — G629 Polyneuropathy, unspecified: Secondary | ICD-10-CM

## 2022-11-11 DIAGNOSIS — K259 Gastric ulcer, unspecified as acute or chronic, without hemorrhage or perforation: Secondary | ICD-10-CM

## 2022-11-11 DIAGNOSIS — B999 Unspecified infectious disease: Secondary | ICD-10-CM

## 2022-11-11 DIAGNOSIS — F419 Anxiety disorder, unspecified: Secondary | ICD-10-CM

## 2022-11-11 DIAGNOSIS — R519 Generalized headaches: Secondary | ICD-10-CM

## 2022-11-11 DIAGNOSIS — D649 Anemia, unspecified: Secondary | ICD-10-CM

## 2022-11-11 DIAGNOSIS — C801 Malignant (primary) neoplasm, unspecified: Secondary | ICD-10-CM

## 2022-11-11 NOTE — Patient Instructions
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  Do not eat anything after midnight and don?t drink anything for 6 hours prior to your scheduled procedure.  You will receive an IV antibiotic and a mild sedation; therefore you must have a driver.   If you have had a recent infection/fever please notify us prior to day of procedure.    Discharge Instructions      There should be minimal drainage and no swelling or redness at the injection sites.  Care of your dressing: Keep bandage clean and dry. May replace with band aids in 48 hrs post procedure. Leave steri-strips in place, they will fall off on their own.  You may experience soreness at the injection site. Ice can be applied at 20 minute intervals. Avoid application of direct heat, hot showers or hot tubs today.  Avoid strenuous activity today. You may resume your regular activities and exercise tomorrow.  Patients taking a daily blood thinner can resume their regular dose next day.  It is important that you take all medications ordered by your pain physician. Taking medication as ordered is an important part of your pain care plan.  If you cannot continue the medication plan, please notify the physician.  Though the procedure is generally safe and complications are rare, we do ask that you be aware of any of the following:  Any swelling, persistent redness, new bleeding, or drainage from the injection site(s).  Severe headache  Fever > 1010F  New onset of sharp, severe neck or back pain  New onset of extremity numbness or weakness  New difficulty controlling bowel or bladder function  New shortness of breath     If any of these occur, please call to report this occurrence to a nurse at [phone]. If you are calling after 4:00 p.m., on weekends or holidays please call [phone] and ask to have the resident physician on call for the physician paged or go to your local emergency room.  If you are unable to keep your upcoming appointment, please notify the [facility] Center scheduler at [phone] at least 24 hours in advance. It was nice to see you today. Thank you for choosing to visit our clinic.       Your time is important and if you had to wait today, we do apologize. Our goal is to run exactly on time; however, on occasion, we get behind in clinic due to unexpected patient issues. Thank you for your patience.    General Instructions:  How to reach me: Please send a MyChart message to the Spine Center or leave a voicemail fat [phone]  How to get a medication refill: Please use the MyChart Refill request or contact your pharmacy directly to request medication refills. We do not do same day refills on controlled substances.  How to receive your test results: If you have signed up for MyChart, you will receive your test results and messages from me this way. Otherwise, you will get a phone call or letter. If you are expecting results and have not heard from my office within 2 weeks of your testing, please send a MyChart message or call my office.  Scheduling: Our scheduling phone number is [phone].  Support for many chronic illnesses is available through [employer]: [facility] or [phone].  For questions  on nights, weekends or holidays, call the Operator at [phone], and ask for the doctor on call for Anesthesia Pain.      Again, thank you for coming in today.

## 2022-11-11 NOTE — Progress Notes
Dear Dr. Benedetto Goad,    I appreciate your kind referral of Madeline Barry for evaluation of pain.   Please see my note below for the full details of the evaluation and management plan.    Thank you,    Bella Kennedy, MD        [facility]      Chief Complaint: Pain  Chief Complaint   Patient presents with    Lower Back - Pain    New Patient     Back pain          HPI:   ORETA THORNGREN is a 57 y.o. female with a past medical history noted below who presents for evaluation at [facility].    Of note, she does have history of previous thoracic compression deformity which symptomatologically improved with over-the-counter analgesics, medications and physical therapy.  She denies a history of osteoporosis. She reports a history of rheumatoid arthritis.     Patient presents today for new patient evaluation for chief complaint of thoracic spine pain.  She reports that the symptoms have been present since December.  Denies any pain going down into the lower extremities, denies radicular symptoms.  She does report pain localized to the thoracolumbar junction, specifically with palpation to the T10, T11, T12, L1 area.  There are some pain referring bilaterally across the chest.  However, she denies any numbness or tingling into the lower extremities.  Denies any bowel bladder dysfunction, balance problems, frequent falls, saddle anesthesia like symptoms.  Pain is described as aching, stabbing, sharp pain localized to the middle back.  Pain is extirpated by prolonged standing, prolonged walking, repetitive bending, lifting twisting.  Pain is part alleviated by rest, ice, heat over-the-counter analgesics.  Patient has a history of cervical spine surgery.  She has tried physical therapy with limited relief.  She has used a thoracolumbar support brace with limited relief.  Most recent thoracic spine MRI from October 06, 2022 on a T1 and high STIR signal MRI noted a a new compression fracture with superior endplate deformity of T11 with worsening height loss greater than 25 percent.     ROS: Janace Litten denies any recent fevers, chills, infection, nausea, vomit, sick contacts, headaches, changes in vision,   antibiotics, bowel or bladder incontinence, saddle anesthesia, bleeding issues.     A 14 point review of systems was performed and was negative, with the exception of the symptoms described in the HPI.       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    PONV (postoperative nausea and vomiting)     Recurrent infections     sinus    Rheumatoid arthritis (HCC)     Rheumatoid arthritis(714.0)  Rosacea     Spinal headache     Stomach ulcer          Family History:  Family History   Problem Relation Age of Onset    Alzheimer's Mother     Heart problem Mother     Neurologic Disorder Mother         Dementia    Diabetes Father     Alcohol liver disease Father         Alcoholic    Arthritis Son         Psoriatic arthritis    Heart problem Brother         Heart attack, AAA         Social History:  Lives in [place] [id number]    Social History     Socioeconomic History    Marital status: Divorced   Tobacco Use    Smoking status: Never    Smokeless tobacco: Never   Vaping Use    Vaping status: Never Used   Substance and Sexual Activity    Alcohol use: Not Currently    Drug use: Never    Sexual activity: Not Currently     Partners: Male     Birth control/protection: Post-menopausal Allergies:  Allergies   Allergen Reactions    Bextra [Valdecoxib] HIVES    Penicillins SEE COMMENTS     YEAST INFECTIONS           Medications:    Current Outpatient Medications:     amitriptyline (ELAVIL) 100 mg tablet, , Disp: , Rfl:     amitriptyline (ELAVIL) 50 mg tablet, Take 75 mg by mouth at bedtime daily., Disp: , Rfl:     atorvastatin (LIPITOR) 10 mg tablet, , Disp: , Rfl:     buPROPion XL (WELLBUTRIN XL) 150 mg tablet, Take 150 mg by mouth daily., Disp: , Rfl:     cholecalciferol (VITAMIN D-3) 1,000 units tablet, Take two tablets by mouth daily., Disp: , Rfl:     cyclobenzaprine (FLEXERIL) 10 mg tablet, Take 10 mg by mouth twice daily., Disp: , Rfl:     doxycycline (VIBRAMYCIN) 100 mg tablet, Take 100 mg by mouth twice daily., Disp: , Rfl:     galcanezumab-gnlm (EMGALITY PEN) 120 mg/mL subcutaneous PEN, Inject 1 mL under the skin every 30 days., Disp: , Rfl:     hydrOXYchloroQUINE 200 mg tablet, , Disp: , Rfl:     oxyCODONE (ROXICODONE) 5 mg tablet, Take 5 mg by mouth twice daily, Disp: , Rfl:     pseudoephedrine HCl (SUDAFED 12 HOUR PO), Take  by mouth., Disp: , Rfl:     temazepam (RESTORIL) 15 mg capsule, Take 15 mg by mouth as Needed., Disp: , Rfl:     traMADoL (ULTRAM) 50 mg tablet, Take one tablet by mouth as Needed., Disp: , Rfl:     venlafaxine XR (EFFEXOR XR) 150 mg capsule, , Disp: , Rfl:     vitamins, multiple tablet, Take one tablet by mouth daily., Disp: , Rfl:         Physical examination:   Pulse 98  - Ht 157.5 cm (5' 2)  - Wt 90.7 kg (200 lb)  - SpO2 96%  - BMI 36.58 kg/m?   Pain Score: Five  Oswestry Total Score:: 38    Gen: No acute distress, cooperative.   HEENT: Normocephalic, atraumatic, hearing grossly intact.   Neck: Supple  Heart: Well perfused   Lungs: No use of accessory muscles of  respiration. No labored breathing observed.   Abdomen: Not distended.    Ext: Purposeful movements  Skin: Dry  Psych: Mood and affect congruent.   Neurological:  Gait: ambulatory without assisted device, gait was smooth and symmetric with equal arm swing.      Mental status/speech: alert, oriented to person/place, and time. Clear speech.   Cranial Nerves: intact visual fields, conjugated EOM. No evidence of facial droop.   Motor: No increased or decreased tone.  No ankle clonus.   No difficulties with Tandem gait. Able to perform heel and toe push off.   Sensation: Grossly intact sensation to light touch   Coordination: No tremors  Musculoskeletal:   Inspection: grossly symmetric, no erythema, increased thoracic kyphosis    Palpation: exquisite TTP over T11 vertebral body, spinous process and paraspinals.     ROM: functional AROM in flexion and extension, side bend, and lateral rotation, pain aggravated with thoracic flexion and extension  Straight leg raise negative bilaterally  FABERE test negative bilaterally   MMT:   Root Right Left   Hip Flexion L2 5 5   Knee Flexion L3/4 5 5    Knee Extension L3 5 5   Dorsiflexion L4 5 5   Plantarflexion S1 5 5   EHL Extension L5 5 5           Diagnostic Imaging:  L-Spine MRI WO 10/06/22  COMPARISON: Lumbar spine MRI 01/07/2022  FINDINGS:  Low T1 and high STIR signal within the superior endplate deformity of T11 with worsening height loss  greater than 25 percent, now measuring 13 mm craniocaudal along the anterior column previously 18  mm. No retropulsion. Remaining thoracic levels are otherwise normal. Small disk bulges at T1-T2,  T2-T3, T10-11, T11-12, and T12-L1 without significant canal or foraminal stenosis. Posterior  elements appear intact. Cord signal is uniform.     IMPRESSION:  New T11 fracture with progressive height loss anteriorly and kyphotic deformity.   No significant retropulsion.      SCOLIOSIS EOS AP/LAT  Result Date: 11/05/2022  Scoliosis Survey. CLINICAL INDICATION: 57 years Female; SCOLIOSIS. COMPARISON: 04/02/2022, SCOLIOSIS EOS AP/LAT   1.  Minimal change since prior.   2.  Similar appearance of C6-C7 ACDF with posterior cerclage wires. Cervical lordosis is preserved.   3.  Similar appearance of compression fracture of the T11 vertebral body with 50% anterior height loss and focal resultant kyphosis.  4.  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   5.  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              Assessment:    MARICIA SCOTTI is a 57 y.o. female who presents for evaluation. The pain complaints are most likely due to:      1. Compression fracture of T11 vertebra, initial encounter (HCC)  KYPHOPLASTY THORACIC          56 YOF with acute thoracic spine pain at T11, exquisite tenderness to palpation and no radicular pain.   Patient has had an adequate trial of > 3 month of rest, exercise, bracing, physical therapy, analgesics and multimodal treatment, and the passage of time without improvement of symptoms. The pain has significant impact on the daily quality of life and physical function.   Thoracic spine MRI (Low T1 and high STIR signal) revealed  bone marrow edema, low signal within the superior endplate deformity of T11 with worsening height loss greater than 25 percent  and no retropulsion or cord signal abnormalities. Clinical history, exam and diagnsotic images findings compatible with new T11 fracture with progressive height loss anteriorly and kyphotic deformity.         Plan:  1. Plan for T11 vertebral augmentation with percutaneous balloon kyphoplasty (PBK).  2. Follow-up.  Patient will follow-up after procedure.       Risks/benefits of all pharmacologic and interventional treatments discussed and questions answered.   Thank you for this kind referral for consultation. Please feel free to contact me with any questions or concerns.     Case discussed with attending physician.       Gareth Morgan, MD  Fellow; Interventional Pain Medicine    Department of Anesthesiology and Pain Medicine  [facility]     ATTESTATION    I personally performed the key portions of the E/M visit, discussed case with resident and concur with resident documentation of history, physical exam, assessment, and treatment plan unless otherwise noted.    Staff name:  Bella Kennedy, MD Date: 11/11/2022

## 2022-11-22 ENCOUNTER — Encounter: Admit: 2022-11-22 | Discharge: 2022-11-22 | Payer: BC Managed Care – PPO

## 2022-11-22 ENCOUNTER — Ambulatory Visit: Admit: 2022-11-22 | Discharge: 2022-11-22 | Payer: BC Managed Care – PPO

## 2022-11-22 DIAGNOSIS — M069 Rheumatoid arthritis, unspecified: Secondary | ICD-10-CM

## 2022-11-22 DIAGNOSIS — D649 Anemia, unspecified: Secondary | ICD-10-CM

## 2022-11-22 DIAGNOSIS — M1990 Unspecified osteoarthritis, unspecified site: Secondary | ICD-10-CM

## 2022-11-22 DIAGNOSIS — G629 Polyneuropathy, unspecified: Secondary | ICD-10-CM

## 2022-11-22 DIAGNOSIS — F32A Depression: Secondary | ICD-10-CM

## 2022-11-22 DIAGNOSIS — C801 Malignant (primary) neoplasm, unspecified: Secondary | ICD-10-CM

## 2022-11-22 DIAGNOSIS — K259 Gastric ulcer, unspecified as acute or chronic, without hemorrhage or perforation: Secondary | ICD-10-CM

## 2022-11-22 DIAGNOSIS — B999 Unspecified infectious disease: Secondary | ICD-10-CM

## 2022-11-22 DIAGNOSIS — K219 Gastro-esophageal reflux disease without esophagitis: Secondary | ICD-10-CM

## 2022-11-22 DIAGNOSIS — S22080A Wedge compression fracture of T11-T12 vertebra, initial encounter for closed fracture: Secondary | ICD-10-CM

## 2022-11-22 DIAGNOSIS — T148XXA Other injury of unspecified body region, initial encounter: Secondary | ICD-10-CM

## 2022-11-22 DIAGNOSIS — M2550 Pain in unspecified joint: Secondary | ICD-10-CM

## 2022-11-22 DIAGNOSIS — H18509 Corneal dystrophy: Secondary | ICD-10-CM

## 2022-11-22 DIAGNOSIS — G971 Other reaction to spinal and lumbar puncture: Secondary | ICD-10-CM

## 2022-11-22 DIAGNOSIS — R112 Nausea with vomiting, unspecified: Secondary | ICD-10-CM

## 2022-11-22 DIAGNOSIS — M797 Fibromyalgia: Secondary | ICD-10-CM

## 2022-11-22 DIAGNOSIS — F419 Anxiety disorder, unspecified: Secondary | ICD-10-CM

## 2022-11-22 DIAGNOSIS — L719 Rosacea, unspecified: Secondary | ICD-10-CM

## 2022-11-22 DIAGNOSIS — R519 Generalized headaches: Secondary | ICD-10-CM

## 2022-11-22 MED ORDER — MIDAZOLAM 1 MG/ML IJ SOLN
1-2 mg | INTRAVENOUS | 0 refills | Status: AC | PRN
Start: 2022-11-22 — End: ?

## 2022-11-22 MED ORDER — FENTANYL CITRATE (PF) 50 MCG/ML IJ SOLN
50-100 ug | INTRAVENOUS | 0 refills | Status: AC | PRN
Start: 2022-11-22 — End: ?

## 2022-11-22 MED ORDER — LIDOCAINE (PF) 10 MG/ML (1 %) IJ SOLN
2 mL | Freq: Once | INTRAMUSCULAR | 0 refills | Status: CP
Start: 2022-11-22 — End: ?

## 2022-11-22 MED ORDER — BUPIVACAINE (PF) 0.5 % (5 MG/ML) IJ SOLN
6 mL | Freq: Once | INTRAMUSCULAR | 0 refills | Status: CP
Start: 2022-11-22 — End: ?

## 2022-11-22 MED ORDER — CLINDAMYCIN ICC (NS) IVPB
900 mg | Freq: Once | INTRAVENOUS | 0 refills | Status: CP
Start: 2022-11-22 — End: ?

## 2022-11-22 NOTE — Discharge Instructions - Supplementary Instructions
KYPHOPLASTY  DISCHARGE INSTRUCTIONS    Important information following your procedure today:   Though the procedure is generally safe, and complications are rare, we do ask that you be aware of any of the following:  Any swelling, persistent redness, new bleeding or drainage from the site of the incision.  You should not experience a severe headache.  You should not run a fever over 101F degrees.  ** If any of these occur, please call to report this occurrence to a nurse at [phone]. If you are calling after 4:00 p.m. or on weekends or holidays, please call [phone] and ask to have the resident physician on call for the physician paged or go to your local emergency room.  You may experience soreness at the injection site. Ice can be applied at 20-minute intervals. Avoid application of direct heat, hot showers or hot tubs today.  Avoid strenuous activity today. You may resume your regular activities and exercise tomorrow.  Patients taking a daily blood thinner can resume their regular dose the next day.  It is important that you take all medications ordered by your pain physician. Taking medication as ordered is an important part of your pain care plan.  If you cannot continue the medication plan, please notify the physician.  If you are getting sedation for your procedure you will need a driver to take you home.    You have small incisions in your back.  Continue to cover these areas with bandaids.   Avoid submerging your wounds under water until cleared at your follow up.      If you are unable to keep your upcoming appointment, please notify the [facility] scheduler at [phone] at least 24 hours in advance. If you have questions for the surgery center, call [facility] at [phone].

## 2022-11-22 NOTE — Procedures
Attending Surgeon: Bella Kennedy, MD    Anesthesia: Local    Pre-Procedure Diagnosis:   1. Compression fracture of T11 vertebra, initial encounter (HCC)        Post-Procedure Diagnosis:   1. Compression fracture of T11 vertebra, initial encounter [facility])        Kyphoplasty Thoracic    Laterality: left      Consent:   Consent obtained: verbal and written  Consent given by: patient  Risks discussed: allergic reaction, bleeding, bruising, infection, nerve damage, no change or worsening in pain and pneumo thorax  Alternatives discussed: alternative treatment, delayed treatment and no treatment  Discussed with patient the purpose of the treatment/procedure, other ways of treating my condition, including no treatment/ procedure and the risks and benefits of the alternatives. Patient has decided to proceed with treatment/procedure.        Universal Protocol:  Relevant documents: relevant documents present and verified  Test results: test results available and properly labeled  Imaging studies: imaging studies available  Required items: required blood products, implants, devices, and special equipment available  Site marked: the operative site was marked  Patient identity confirmed: Patient identify confirmed verbally with patient.        Time out: Immediately prior to procedure a time out was called to verify the correct patient, procedure, equipment, support staff and site/side marked as required      Procedures Details:   Indications: pain     Prep: chlorhexidine  Sedation: moderate sedation  Sedation Medications: Fentanyl 150 mcg and Versed 3 mg  Patient position: prone  Estimated Blood Loss: minimal  Specimens: other (use comment box)  Amount Injected:   T11 Left: 4mL  T11 Right: 0mL    Number of Levels Ablated: 1  Approach: Unipedicular  Guidance: fluoroscopy  Contrast: Procedure confirmed with contrast under live fluoroscopy.  Target Lesion: T11  Balloon Used: Balloon used to restore vertrebral height  Pedicle Access: Transpedicular  Injection procedure: Incremental injection and Negative aspiration for blood  Biopsy Performed: Yes  Patient tolerance: Patient tolerated the procedure well with no immediate complications. Pressure was applied, and hemostasis was accomplished.  Outcome: Pain unchanged    Estimated blood loss: none or minimal  Specimens: none  Patient tolerated the procedure well with no immediate complications. Pressure was applied, and hemostasis was accomplished.  Administrations This Visit       bupivacaine PF (MARCAINE) 0.5 % injection 6 mL       Admin Date  11/22/2022 Action  Given Dose  6 mL Route  Injection Documented By  Bella Kennedy, MD              clindamycin (CLEOCIN) 900 mg in sodium chloride 0.9% (NS) 50 mL IVPB       Admin Date  11/22/2022 Action  Given - New Bag Dose  900 mg Rate  100 mL/hr Route  Intravenous Documented By  Tamera Punt, RN              fentaNYL citrate PF (SUBLIMAZE) injection 50-100 mcg       Admin Date  11/22/2022 Action  Given Dose  100 mcg Route  Intravenous Documented By  Beola Cord, RN              lidocaine PF 1% (10 mg/mL) injection 2 mL       Admin Date  11/22/2022 Action  Given Dose  2 mL Route  Injection Documented By  Bella Kennedy, MD  midazolam (VERSED) injection 1-2 mg       Admin Date  11/22/2022 Action  Given Dose  2 mg Route  Intravenous Documented By  Beola Cord, RN

## 2022-11-22 NOTE — Progress Notes
Pt tolerated procedure well. No complaints of pain at this time. VS remained stable during recovery. Pt's up time and HOB was elevated at 1310. Pt discharged home.

## 2022-12-09 ENCOUNTER — Encounter: Admit: 2022-12-09 | Discharge: 2022-12-09 | Payer: BC Managed Care – PPO

## 2023-02-16 ENCOUNTER — Encounter: Admit: 2023-02-16 | Discharge: 2023-02-16 | Payer: BC Managed Care – PPO

## 2024-02-20 ENCOUNTER — Encounter: Admit: 2024-02-20 | Discharge: 2024-02-20 | Payer: BLUE CROSS/BLUE SHIELD

## 2024-02-21 ENCOUNTER — Encounter: Admit: 2024-02-21 | Discharge: 2024-02-21 | Payer: BLUE CROSS/BLUE SHIELD

## 2024-09-16 ENCOUNTER — Encounter: Admit: 2024-09-16 | Discharge: 2024-09-16 | Payer: BLUE CROSS/BLUE SHIELD

## 2024-09-17 ENCOUNTER — Encounter: Admit: 2024-09-17 | Discharge: 2024-09-17 | Payer: BLUE CROSS/BLUE SHIELD

## 2024-09-17 ENCOUNTER — Ambulatory Visit: Admit: 2024-09-17 | Discharge: 2024-09-17 | Payer: BLUE CROSS/BLUE SHIELD

## 2024-09-17 VITALS — BP 154/87 | HR 94 | Resp 16 | Ht 62.0 in | Wt 206.2 lb

## 2024-09-17 DIAGNOSIS — M0600 Rheumatoid arthritis without rheumatoid factor, unspecified site: Principal | ICD-10-CM

## 2024-09-17 DIAGNOSIS — K760 Fatty (change of) liver, not elsewhere classified: Secondary | ICD-10-CM

## 2024-09-17 DIAGNOSIS — M542 Cervicalgia: Secondary | ICD-10-CM

## 2024-09-17 DIAGNOSIS — J329 Chronic sinusitis, unspecified: Secondary | ICD-10-CM

## 2024-09-17 DIAGNOSIS — M5030 Other cervical disc degeneration, unspecified cervical region: Secondary | ICD-10-CM

## 2024-09-17 DIAGNOSIS — M2550 Pain in unspecified joint: Secondary | ICD-10-CM

## 2024-09-17 MED ORDER — FOLIC ACID 1 MG PO TAB
1 mg | ORAL_TABLET | Freq: Every day | ORAL | 3 refills | 30.00000 days | Status: AC
Start: 2024-09-17 — End: ?

## 2024-09-17 MED ORDER — METHOTREXATE SODIUM 2.5 MG PO TAB
10 mg | ORAL_TABLET | ORAL | 0 refills | 28.00000 days | Status: AC
Start: 2024-09-17 — End: ?

## 2024-09-18 ENCOUNTER — Encounter: Admit: 2024-09-18 | Discharge: 2024-09-18 | Payer: BLUE CROSS/BLUE SHIELD

## 2024-09-18 NOTE — Telephone Encounter [[id number]]
 Faxed standing lab orders to [facility] [phone] w/ fax confirmation received.

## 2024-09-18 NOTE — Telephone Encounter [36]
-----   Message from SHAUNNA Leventhal, DO sent at 09/17/2024  2:13 PM CST -----  Can you send her standing lab orders to [facility]?  Thank you

## 2024-09-20 ENCOUNTER — Encounter: Admit: 2024-09-20 | Discharge: 2024-09-20 | Payer: BLUE CROSS/BLUE SHIELD
